# Patient Record
Sex: Female | Born: 1975 | Race: White | Hispanic: No | Smoking: Current every day smoker
Health system: Southern US, Community
[De-identification: ages and names within clinical notes are randomized; demographics above are authoritative.]

## PROBLEM LIST (undated history)

## (undated) DIAGNOSIS — F419 Anxiety disorder, unspecified: Secondary | ICD-10-CM

## (undated) HISTORY — PX: FEMUR SURGERY: SHX943

## (undated) HISTORY — PX: TIBIA FRACTURE SURGERY: SHX806

---

## 2020-03-04 DIAGNOSIS — A6000 Herpesviral infection of urogenital system, unspecified: Secondary | ICD-10-CM

## 2020-03-04 HISTORY — DX: Herpesviral infection of urogenital system, unspecified: A60.00

## 2020-06-22 ENCOUNTER — Ambulatory Visit: Payer: Self-pay | Admitting: Family Medicine

## 2020-10-01 ENCOUNTER — Emergency Department (HOSPITAL_COMMUNITY): Payer: Medicaid Other

## 2020-10-01 ENCOUNTER — Encounter (HOSPITAL_COMMUNITY): Payer: Self-pay | Admitting: Emergency Medicine

## 2020-10-01 ENCOUNTER — Other Ambulatory Visit: Payer: Self-pay

## 2020-10-01 ENCOUNTER — Emergency Department (HOSPITAL_COMMUNITY)
Admission: EM | Admit: 2020-10-01 | Discharge: 2020-10-02 | Disposition: A | Discharge status: Home / Self Care | Payer: Medicaid Other | Attending: Emergency Medicine | Admitting: Emergency Medicine

## 2020-10-01 DIAGNOSIS — F1910 Other psychoactive substance abuse, uncomplicated: Secondary | ICD-10-CM | POA: Diagnosis not present

## 2020-10-01 DIAGNOSIS — Y900 Blood alcohol level of less than 20 mg/100 ml: Secondary | ICD-10-CM | POA: Insufficient documentation

## 2020-10-01 DIAGNOSIS — E876 Hypokalemia: Secondary | ICD-10-CM | POA: Insufficient documentation

## 2020-10-01 DIAGNOSIS — R21 Rash and other nonspecific skin eruption: Secondary | ICD-10-CM | POA: Diagnosis not present

## 2020-10-01 DIAGNOSIS — S0990XA Unspecified injury of head, initial encounter: Secondary | ICD-10-CM | POA: Insufficient documentation

## 2020-10-01 LAB — CBC WITH DIFFERENTIAL/PLATELET
Abs Immature Granulocytes: 0.02 10*3/uL (ref 0.00–0.07)
Basophils Absolute: 0.1 10*3/uL (ref 0.0–0.1)
Basophils Relative: 1 %
Eosinophils Absolute: 0.1 10*3/uL (ref 0.0–0.5)
Eosinophils Relative: 1 %
HCT: 36 % (ref 36.0–46.0)
Hemoglobin: 12.8 g/dL (ref 12.0–15.0)
Immature Granulocytes: 0 %
Lymphocytes Relative: 20 %
Lymphs Abs: 1.7 10*3/uL (ref 0.7–4.0)
MCH: 33.2 pg (ref 26.0–34.0)
MCHC: 35.6 g/dL (ref 30.0–36.0)
MCV: 93.5 fL (ref 80.0–100.0)
Monocytes Absolute: 0.7 10*3/uL (ref 0.1–1.0)
Monocytes Relative: 9 %
Neutro Abs: 5.8 10*3/uL (ref 1.7–7.7)
Neutrophils Relative %: 69 %
Platelets: 322 10*3/uL (ref 150–400)
RBC: 3.85 MIL/uL — ABNORMAL LOW (ref 3.87–5.11)
RDW: 13.2 % (ref 11.5–15.5)
WBC: 8.4 10*3/uL (ref 4.0–10.5)
nRBC: 0 % (ref 0.0–0.2)

## 2020-10-01 LAB — URINALYSIS, ROUTINE W REFLEX MICROSCOPIC
Bilirubin Urine: NEGATIVE
Glucose, UA: NEGATIVE mg/dL
Ketones, ur: 20 mg/dL — AB
Leukocytes,Ua: NEGATIVE
Nitrite: POSITIVE — AB
Protein, ur: 30 mg/dL — AB
Specific Gravity, Urine: 1.029 (ref 1.005–1.030)
pH: 5 (ref 5.0–8.0)

## 2020-10-01 LAB — ETHANOL: Alcohol, Ethyl (B): <10 mg/dL (ref <10)

## 2020-10-01 LAB — COMPREHENSIVE METABOLIC PANEL
ALT: 22 U/L (ref 0–44)
AST: 24 U/L (ref 15–41)
Albumin: 4 g/dL (ref 3.5–5.0)
Alkaline Phosphatase: 42 U/L (ref 38–126)
Anion gap: 10 (ref 5–15)
BUN: 13 mg/dL (ref 6–20)
CO2: 23 mmol/L (ref 22–32)
Calcium: 9 mg/dL (ref 8.9–10.3)
Chloride: 105 mmol/L (ref 98–111)
Creatinine, Ser: 0.97 mg/dL (ref 0.44–1.00)
GFR, Estimated: >60 mL/min (ref >60)
Glucose, Bld: 113 mg/dL — ABNORMAL HIGH (ref 70–99)
Potassium: 3.1 mmol/L — ABNORMAL LOW (ref 3.5–5.1)
Sodium: 138 mmol/L (ref 135–145)
Total Bilirubin: 1.8 mg/dL — ABNORMAL HIGH (ref 0.3–1.2)
Total Protein: 6.7 g/dL (ref 6.5–8.1)

## 2020-10-01 LAB — RAPID URINE DRUG SCREEN, HOSP PERFORMED
Amphetamines: POSITIVE — AB
Barbiturates: NOT DETECTED
Benzodiazepines: POSITIVE — AB
Cocaine: POSITIVE — AB
Opiates: NOT DETECTED
Tetrahydrocannabinol: NOT DETECTED

## 2020-10-01 LAB — ACETAMINOPHEN LEVEL: Acetaminophen (Tylenol), Serum: <10 ug/mL — ABNORMAL LOW (ref 10–30)

## 2020-10-01 LAB — POC URINE PREG, ED: Preg Test, Ur: NEGATIVE

## 2020-10-01 LAB — SALICYLATE LEVEL: Salicylate Lvl: <7 mg/dL — ABNORMAL LOW (ref 7.0–30.0)

## 2020-10-01 MED ORDER — SODIUM CHLORIDE 0.9 % IV BOLUS
1000.0000 mL | Freq: Once | INTRAVENOUS | Status: AC
  Administered 2020-10-01: 1000 mL via INTRAVENOUS

## 2020-10-01 MED ORDER — SODIUM CHLORIDE 0.9 % IV SOLN
1.0000 g | Freq: Once | INTRAVENOUS | Status: AC
  Administered 2020-10-01: 1 g via INTRAVENOUS
  Filled 2020-10-01: qty 10

## 2020-10-01 MED ORDER — FOSFOMYCIN TROMETHAMINE 3 G PO PACK
3.0000 g | PACK | Freq: Once | ORAL | Status: AC
  Administered 2020-10-01: 3 g via ORAL
  Filled 2020-10-01 (×2): qty 3
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
## 2020-10-01 NOTE — ED Notes (Signed)
PT in bed resting. Medicated as ordered

## 2020-10-01 NOTE — ED Notes (Signed)
Called pharmacy to have them send meds
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
## 2020-10-01 NOTE — ED Notes (Signed)
Pt BP 80/67, Dr Adela Lank notified, 1L NS bolus started

## 2020-10-01 NOTE — ED Notes (Signed)
PT awake ambulated to the bathroom without assistance. Food and drink provided as requested

## 2020-10-02 NOTE — ED Notes (Signed)
Pt ready to go d/c papers given and IV out. PT states "she has no where to go and is looking for a number."

## 2020-10-11 ENCOUNTER — Encounter (HOSPITAL_COMMUNITY): Payer: Self-pay | Admitting: *Deleted

## 2020-10-11 ENCOUNTER — Other Ambulatory Visit: Payer: Self-pay

## 2020-10-11 ENCOUNTER — Emergency Department (HOSPITAL_COMMUNITY)
Admission: EM | Admit: 2020-10-11 | Discharge: 2020-10-11 | Disposition: A | Discharge status: Home / Self Care | Payer: Medicaid Other | Attending: Emergency Medicine | Admitting: Emergency Medicine

## 2020-10-11 DIAGNOSIS — Z5321 Procedure and treatment not carried out due to patient leaving prior to being seen by health care provider: Secondary | ICD-10-CM | POA: Insufficient documentation

## 2020-10-11 DIAGNOSIS — R21 Rash and other nonspecific skin eruption: Secondary | ICD-10-CM | POA: Insufficient documentation

## 2020-10-11 HISTORY — DX: Anxiety disorder, unspecified: F41.9
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
## 2020-10-11 NOTE — ED Notes (Signed)
The patient LWBS. The patient informed a Barrister's clerk

## 2020-10-13 ENCOUNTER — Encounter: Payer: Self-pay | Admitting: *Deleted

## 2020-10-13 NOTE — Congregational Nurse Program (Signed)
  Dept: [phone]   Congregational Nurse Program Note  Date of Encounter: 10/13/2020  Past Medical History: Past Medical History:  Diagnosis Date   Anxiety     Encounter Details:  CNP Questionnaire - 10/13/20 1108       Questionnaire   Do you give verbal consent to treat you today? Yes    Visit Setting Church or Organization    Location Patient Served At [facility]    Patient Status Homeless    Medical Provider No    Insurance Medicaid    Intervention Support;Refer    Housing/Utilities No permanent housing    Referrals Mobile Bus/Van - [place]             Client seen at [facility] with rash on multiple parts of her body including arms, legs and buttocks. She reports she has taken benedryl with no relief. She also has areas that appear to be insect bites on her legs and says she has been sleeping outside. Referred to [facility] clinic 10/14/20 at [facility]. Colvin Blatt W RN CN [phone]

## 2020-11-16 ENCOUNTER — Emergency Department (HOSPITAL_COMMUNITY): Payer: Medicaid Other

## 2020-11-16 ENCOUNTER — Emergency Department (HOSPITAL_COMMUNITY)
Admission: EM | Admit: 2020-11-16 | Discharge: 2020-11-16 | Disposition: A | Discharge status: Home / Self Care | Payer: Medicaid Other | Attending: Emergency Medicine | Admitting: Emergency Medicine

## 2020-11-16 DIAGNOSIS — M545 Low back pain, unspecified: Secondary | ICD-10-CM | POA: Insufficient documentation

## 2020-11-16 DIAGNOSIS — H1131 Conjunctival hemorrhage, right eye: Secondary | ICD-10-CM | POA: Diagnosis not present

## 2020-11-16 DIAGNOSIS — F419 Anxiety disorder, unspecified: Secondary | ICD-10-CM | POA: Diagnosis not present

## 2020-11-16 DIAGNOSIS — R519 Headache, unspecified: Secondary | ICD-10-CM | POA: Diagnosis not present

## 2020-11-16 DIAGNOSIS — F17210 Nicotine dependence, cigarettes, uncomplicated: Secondary | ICD-10-CM | POA: Diagnosis not present

## 2020-11-16 DIAGNOSIS — Y9301 Activity, walking, marching and hiking: Secondary | ICD-10-CM | POA: Diagnosis not present

## 2020-11-16 DIAGNOSIS — Y92410 Unspecified street and highway as the place of occurrence of the external cause: Secondary | ICD-10-CM | POA: Insufficient documentation

## 2020-11-16 DIAGNOSIS — J3489 Other specified disorders of nose and nasal sinuses: Secondary | ICD-10-CM | POA: Diagnosis not present

## 2020-11-16 DIAGNOSIS — N9489 Other specified conditions associated with female genital organs and menstrual cycle: Secondary | ICD-10-CM | POA: Insufficient documentation

## 2020-11-16 DIAGNOSIS — S022XXA Fracture of nasal bones, initial encounter for closed fracture: Secondary | ICD-10-CM

## 2020-11-16 LAB — I-STAT BETA HCG BLOOD, ED (MC, WL, AP ONLY): I-stat hCG, quantitative: <5 m[IU]/mL (ref <5)

## 2020-11-16 MED ORDER — IBUPROFEN 800 MG PO TABS
800.0000 mg | ORAL_TABLET | Freq: Once | ORAL | Status: AC
  Administered 2020-11-16: 800 mg via ORAL
  Filled 2020-11-16: qty 1

## 2020-11-16 MED ORDER — OXYCODONE-ACETAMINOPHEN 5-325 MG PO TABS
1.0000 | ORAL_TABLET | Freq: Once | ORAL | Status: AC
  Administered 2020-11-16: 1 via ORAL
  Filled 2020-11-16: qty 1

## 2020-11-16 MED ORDER — LORAZEPAM 1 MG PO TABS
1.0000 mg | ORAL_TABLET | Freq: Once | ORAL | Status: AC
  Administered 2020-11-16: 1 mg via ORAL
  Filled 2020-11-16: qty 1

## 2020-11-16 MED ORDER — FLUORESCEIN SODIUM 1 MG OP STRP
1.0000 | ORAL_STRIP | Freq: Once | OPHTHALMIC | Status: AC
  Administered 2020-11-16: 1 via OPHTHALMIC
  Filled 2020-11-16: qty 1

## 2020-11-16 MED ORDER — TETRACAINE HCL 0.5 % OP SOLN
2.0000 [drp] | Freq: Once | OPHTHALMIC | Status: AC
  Administered 2020-11-16: 2 [drp] via OPHTHALMIC
  Filled 2020-11-16: qty 4
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
## 2020-11-16 NOTE — ED Notes (Signed)
Prpl and Lt Green tubes sent to lab as saves.
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
## 2020-11-16 NOTE — ED Notes (Signed)
Pt ambulatory without assistance.  
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
## 2021-06-06 ENCOUNTER — Other Ambulatory Visit: Payer: Self-pay | Admitting: *Deleted

## 2021-06-06 ENCOUNTER — Encounter: Payer: Self-pay | Admitting: Physician Assistant

## 2021-06-06 DIAGNOSIS — F411 Generalized anxiety disorder: Secondary | ICD-10-CM

## 2021-06-06 MED ORDER — HYDROXYZINE HCL 10 MG PO TABS: 25.0000 mg | ORAL_TABLET | Freq: Four times a day (QID) | ORAL | Status: DC | PRN

## 2021-06-06 NOTE — Progress Notes (Addendum)
Pt has been at the shelter for about a week.  ? ?She has been in [place] for about a year. ? ?She has a history of anxiety, says she has been on on clonazepam 2 mg. ? ?Clonidine 2 mg Rx confirmed, take 1/2-1 tab tid prn, 90. Dr Zenon Mayo 01/2021 ? ?Hydroxyzine 25 mg tid, #120  Dr Ollen Bowl (pt says is her father, has retired and not local) ? ?05/2020 Acyclovir 800 mg # 14 was for genital herpes, does not need it now.  ? ?Panic attacks, she gets them multiple times every day. They incapacitate her in many ways. She has been having them for the last 25 years.  ? ?Her daughter was 58 months old when she was diagnosed. She only has one child. Her daughter is now 70, lives in [place] with  her boyfriend. ? ?[facility] was [facility] for [facility], transportation to be provided.  ? ?We will give rx for hydroxyzine. She was given Tylenol pm.  ? ?Theodore Demark, PA-C ?06/06/2021 ?2:37 PM ? ? ? ? ? ? ? ? ? ?

## 2021-06-07 ENCOUNTER — Other Ambulatory Visit (HOSPITAL_COMMUNITY): Payer: Self-pay

## 2021-06-07 MED ORDER — HYDROXYZINE PAMOATE 25 MG PO CAPS
25.0000 mg | ORAL_CAPSULE | Freq: Four times a day (QID) | ORAL | 0 refills | Status: DC | PRN
  Filled 2021-06-07: qty 120, 30d supply, fill #0

## 2021-06-13 ENCOUNTER — Encounter: Payer: Self-pay | Admitting: Physician Assistant

## 2021-06-13 NOTE — Progress Notes (Signed)
Pt has been taking someone else's gabapentin. She has stopped that because it made her feel bad. She is concerned that she will have withdrawal from this. ? ?She took the hydrozyzine once, but said it made her feel bad.  ? ?It was suggested that she take it before bedtime, timing tbd by her. ? ?She has seen Dr Zenon Mayo in [place]. Previously in [place], but has been here about 12 years.  ?[address] ?[place] [address] ?[phone] ? ?Theodore Demark, PA-C ?06/13/2021 ?3:48 PM ? ? ? ? ? ?

## 2021-06-18 ENCOUNTER — Encounter (HOSPITAL_COMMUNITY): Payer: Self-pay | Admitting: Licensed Clinical Social Worker

## 2021-06-18 ENCOUNTER — Ambulatory Visit (INDEPENDENT_AMBULATORY_CARE_PROVIDER_SITE_OTHER): Payer: Medicaid Other | Admitting: Licensed Clinical Social Worker

## 2021-06-18 DIAGNOSIS — F411 Generalized anxiety disorder: Secondary | ICD-10-CM | POA: Diagnosis not present

## 2021-06-18 DIAGNOSIS — F331 Major depressive disorder, recurrent, moderate: Secondary | ICD-10-CM | POA: Insufficient documentation

## 2021-06-18 NOTE — Progress Notes (Signed)
Comprehensive Clinical Assessment (CCA) Screening, Triage and Referral Note ? ?06/18/2021 ?Lile Mccurley ?[id number] ? ?Chief Complaint:  ?Chief Complaint  ?Patient presents with  ? Panic Attack  ? Anxiety  ? Depression  ? ?Visit Diagnosis: MDD and GAD  ? ?Client is a 46 year old female. Client is referred by [facility] fpr depression and anxiety   ?Client states mental health symptoms as evidenced by:  ? ?Depression Difficulty Concentrating; Fatigue; Hopelessness; Worthlessness; Increase/decrease in appetite; Irritability; Sleep (too much or little) Difficulty Concentrating; Fatigue; Hopelessness; Worthlessness; Increase/decrease in appetite; Irritability; Sleep (too much or little)  ?Duration of Depressive Symptoms Greater than two weeks Greater than two weeks  ?Mania Irritability Irritability  ?Anxiety Irritability; Worrying; Tension; Sleep; Restlessness; Fatigue; Difficulty concentrating Irritability; Worrying; Tension; Sleep; Restlessness; Fatigue; Difficulty concentrating  ?Psychosis None None  ?Trauma None None  ?Obsessions None None  ?Compulsions None None  ?Inattention None None  ?Hyperactivity/Impulsivity None None  ?Oppositional/Defiant Behaviors None None  ?Emotional Irregularity None None  ? ?Client denies suicidal and homicidal ideations at this time  ?Client denies hallucinations and delusions at this time . ? ?Client was screened for the following SDOH: Smoking, financials, food, transportation, exercise, stress\tension, social interaction, domestic violence, depression, and housing ? ?Assessment Information that integrates subjective and objective details with a therapist's professional interpretation:  ? ? Patient was alert and oriented x5.  Patient was pleasant, cooperative, maintained good eye contact.  She engaged well in therapy session and was dressed casually. she presented today with depressed and anxious mood affect ? ? Patient comes in today for as a referral from the [facility].  Patient  reports that she has been living there for about 3 weeks and it is classified as a shelter.  Patient reports history of anxiety and depression.  This has been managed in the past by Lexapro last taken 2 years ago.  And also Klonopin last dose taken 3 months ago.  Patient reports primary goal is to get established with [facility] for medication management.  Patient reports that her primary support system is the [facility].  Patient reports average relationship with her family as she navigates through her mental health and housing situation.  LCSW did recommend therapy to patient and patient declined it at this time opting for medication management only. ? ?Client meets criteria for: MDD and GAD  ?Client states use of the following substances: None reported ? ? ? ? ? ?Client was in agreement with treatment recommendations. ? ? ?Patient Reported Information ? ?What Do You Feel Would Help You the Most Today? Treatment for Depression or other mood problem; Stress Management ? ? ?Have You Recently Had Any Thoughts About Hurting Yourself? No ? ?Are You Planning to Commit Suicide/Harm Yourself At This time? No ? ? ?Have you Recently Had Thoughts About Hurting Someone Karolee Ohs? No ? ?Are You Planning to Harm Someone at This Time? No ? ? ? ?Have You Used Any Alcohol or Drugs in the Past 24 Hours? No ? ?Do You Currently Have a Therapist/Psychiatrist? No ? ? ?Have You Been Recently Discharged From Any Office Practice or Programs? No ? ?CCA Screening Triage Referral Assessment ?Type of Contact: Face-to-Face ? ?Location of Assessment: [facility] Assessment Services ? ?Provider Location: [facility] Assessment Services ? ? ?Is CPS involved or ever been involved? Never ? ?Is APS involved or ever been involved? Never ? ? ?Patient Determined To Be At Risk for Harm To Self or Others Based on Review of  Patient Reported Information or Presenting Complaint? No ? ? ? ?[place] of Residence: [place] ? ? ?  ? ?Weber Cooks, LCSW ? ? ?  ?  ?  ? ? ?

## 2021-06-20 ENCOUNTER — Ambulatory Visit (INDEPENDENT_AMBULATORY_CARE_PROVIDER_SITE_OTHER): Payer: Medicaid Other | Admitting: Physician Assistant

## 2021-06-20 ENCOUNTER — Encounter (HOSPITAL_COMMUNITY): Payer: Self-pay | Admitting: Physician Assistant

## 2021-06-20 VITALS — BP 133/69 | HR 89 | Ht 63.0 in | Wt 166.0 lb

## 2021-06-20 DIAGNOSIS — F411 Generalized anxiety disorder: Secondary | ICD-10-CM | POA: Diagnosis not present

## 2021-06-20 DIAGNOSIS — F331 Major depressive disorder, recurrent, moderate: Secondary | ICD-10-CM

## 2021-06-20 MED ORDER — HYDROXYZINE HCL 25 MG PO TABS: 25.0000 mg | ORAL_TABLET | Freq: Three times a day (TID) | ORAL | 1 refills | Status: DC | PRN

## 2021-06-20 MED ORDER — BUSPIRONE HCL 7.5 MG PO TABS: 7.5000 mg | ORAL_TABLET | Freq: Two times a day (BID) | ORAL | 1 refills | Status: DC

## 2021-06-20 MED ORDER — ESCITALOPRAM OXALATE 10 MG PO TABS: 10.0000 mg | ORAL_TABLET | Freq: Every day | ORAL | 1 refills | Status: DC

## 2021-06-20 MED ORDER — CLONAZEPAM 0.5 MG PO TABS
0.5000 mg | ORAL_TABLET | Freq: Three times a day (TID) | ORAL | 0 refills | Status: DC | PRN
Start: 2021-06-20 — End: 2021-07-04
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
## 2021-07-04 ENCOUNTER — Other Ambulatory Visit (HOSPITAL_COMMUNITY): Payer: Self-pay | Admitting: Physician Assistant

## 2021-07-04 ENCOUNTER — Telehealth (HOSPITAL_COMMUNITY): Payer: Self-pay | Admitting: Physician Assistant

## 2021-07-04 DIAGNOSIS — F411 Generalized anxiety disorder: Secondary | ICD-10-CM

## 2021-07-04 MED ORDER — CLONAZEPAM 0.5 MG PO TABS: 0.5000 mg | ORAL_TABLET | Freq: Three times a day (TID) | ORAL | 0 refills | Status: DC | PRN

## 2021-07-04 NOTE — Telephone Encounter (Signed)
Provider was contacted by Earnestine Mealing. Patient's medication to be e-prescribed to pharmacy of choice.

## 2021-07-04 NOTE — Telephone Encounter (Signed)
Patient called, requesting refill on Klonopin. ?

## 2021-07-04 NOTE — Progress Notes (Signed)
Provider was contacted by Lyric Dew. Patient's medication to be e-prescribed to pharmacy of choice.

## 2021-07-18 ENCOUNTER — Telehealth (HOSPITAL_COMMUNITY): Payer: Self-pay | Admitting: Physician Assistant

## 2021-07-18 ENCOUNTER — Other Ambulatory Visit (HOSPITAL_COMMUNITY): Payer: Self-pay | Admitting: Physician Assistant

## 2021-07-18 DIAGNOSIS — F411 Generalized anxiety disorder: Secondary | ICD-10-CM

## 2021-07-18 NOTE — Telephone Encounter (Signed)
Patient contacted office to request refill on Klonopin.  ?

## 2021-07-26 NOTE — Telephone Encounter (Signed)
Provider was contacted by Earnestine Mealing regarding refill request for patient's medication.  Patient's medication was e-prescribed to pharmacy of choice.

## 2021-08-01 ENCOUNTER — Telehealth (HOSPITAL_COMMUNITY): Payer: Self-pay | Admitting: *Deleted

## 2021-08-01 ENCOUNTER — Other Ambulatory Visit (HOSPITAL_COMMUNITY): Payer: Self-pay | Admitting: Physician Assistant

## 2021-08-01 DIAGNOSIS — F411 Generalized anxiety disorder: Secondary | ICD-10-CM

## 2021-08-01 NOTE — Telephone Encounter (Signed)
Called her back and had a poor connection. Tried to tell her she had available refills not sure what she was able to understand.

## 2021-08-02 NOTE — Telephone Encounter (Signed)
Opened a second time in error. 

## 2021-08-02 NOTE — Telephone Encounter (Signed)
Opened in error a second time. 

## 2021-08-07 ENCOUNTER — Ambulatory Visit (INDEPENDENT_AMBULATORY_CARE_PROVIDER_SITE_OTHER): Payer: Medicaid Other | Admitting: Physician Assistant

## 2021-08-07 ENCOUNTER — Encounter (HOSPITAL_COMMUNITY): Payer: Self-pay | Admitting: Physician Assistant

## 2021-08-07 DIAGNOSIS — F411 Generalized anxiety disorder: Secondary | ICD-10-CM

## 2021-08-07 DIAGNOSIS — F331 Major depressive disorder, recurrent, moderate: Secondary | ICD-10-CM | POA: Diagnosis not present

## 2021-08-07 MED ORDER — BUSPIRONE HCL 7.5 MG PO TABS: 7.5000 mg | ORAL_TABLET | Freq: Two times a day (BID) | ORAL | 2 refills | Status: DC

## 2021-08-07 MED ORDER — HYDROXYZINE HCL 25 MG PO TABS: 25.0000 mg | ORAL_TABLET | Freq: Three times a day (TID) | ORAL | 2 refills | Status: DC | PRN

## 2021-08-07 MED ORDER — ESCITALOPRAM OXALATE 10 MG PO TABS: 10.0000 mg | ORAL_TABLET | Freq: Every day | ORAL | 2 refills | Status: DC

## 2021-08-10 ENCOUNTER — Encounter (HOSPITAL_COMMUNITY): Payer: Self-pay | Admitting: Physician Assistant

## 2021-08-10 NOTE — Progress Notes (Addendum)
BH MD/PA/NP OP Progress Note  08/10/2021 9:03 PM Jill Pena  MRN:  [id number]  Chief Complaint:  Chief Complaint  Patient presents with   Medication Management    F/U MM   HPI:   Jill Pena is a 46 year old female with a past psychiatric history significant for generalized anxiety disorder and major depressive disorder who presents to [facility] for follow-up and medication management.  Patient is currently being managed on the following medications:  Clonazepam 0.5 mg 3 times daily as needed Hydroxyzine 25 mg 3 times daily as needed Escitalopram 10 mg daily Buspirone 7.5 mg 2 times daily  Patient reports that she feels so much better since being placed back on her current medications.  Patient reports no issues or concerns regarding her current medication regimen.  Patient denies the need for dosage adjustments at this time and is requesting refills on her medications following the conclusion of the encounter.  Since being placed back on her medications, patient denies experiencing depressive symptoms and states that her anxiety has been virtually nonexistent.  Patient denies any new stressors at this time.  Patient is alert and oriented x4, pleasant, calm, cooperative, and fully engaged in conversation during the encounter.  Patient reports being happy and relieved.  Patient denies suicidal or homicidal ideations.  She further denies auditory or visual hallucinations and does not appear to be responding to internal/external stimuli.  Patient endorses good sleep and receives on average 8 to 12 hours of sleep each night.  Patient endorses good appetite and eats on average 2 to 3 meals per day.  Patient denies alcohol consumption and illicit drug use.  Patient endorses tobacco use of "on average 1/2 pack/day.  Visit Diagnosis:    ICD-10-CM   1. GAD (generalized anxiety disorder)  F41.1 escitalopram (LEXAPRO) 10 MG tablet    hydrOXYzine (ATARAX)  25 MG tablet    busPIRone (BUSPAR) 7.5 MG tablet    2. Major depressive disorder, recurrent episode, moderate (HCC)  F33.1 escitalopram (LEXAPRO) 10 MG tablet      Past Psychiatric History:  Generalized anxiety disorder Major depressive disorder  Past Medical History:  Past Medical History:  Diagnosis Date   Anxiety    Genital herpes 2022    Past Surgical History:  Procedure Laterality Date   FEMUR SURGERY     TIBIA FRACTURE SURGERY      Family Psychiatric History:  Mother - anxiety/panic attacks Grandmother (maternal) - OCD  Family History:  Family History  Problem Relation Age of Onset   Anxiety disorder Mother     Social History:  Social History   Socioeconomic History   Marital status: Single    Spouse name: Not on file   Number of children: Not on file   Years of education: Not on file   Highest education level: Not on file  Occupational History   Not on file  Tobacco Use   Smoking status: Every Day    Packs/day: 1.00    Types: Cigarettes   Smokeless tobacco: Never  Substance and Sexual Activity   Alcohol use: Not Currently   Drug use: Not Currently   Sexual activity: Yes    Partners: Male    Birth control/protection: None  Other Topics Concern   Not on file  Social History Narrative   Not on file   Social Determinants of Health   Financial Resource Strain: High Risk (06/18/2021)   Overall Financial Resource Strain (CARDIA)    Difficulty of  Paying Living Expenses: Very hard  Food Insecurity: Food Insecurity Present (06/18/2021)   Hunger Vital Sign    Worried About Running Out of Food in the Last Year: Often true    Ran Out of Food in the Last Year: Often true  Transportation Needs: Unmet Transportation Needs (06/18/2021)   PRAPARE - Administrator, Civil ServiceTransportation    Lack of Transportation (Medical): Yes    Lack of Transportation (Non-Medical): Yes  Physical Activity: Inactive (06/18/2021)   Exercise Vital Sign    Days of Exercise per Week: 0 days     Minutes of Exercise per Session: 0 min  Stress: Stress Concern Present (06/18/2021)   [employer] - Occupational Stress Questionnaire    Feeling of Stress : Very much  Social Connections: Socially Isolated (06/18/2021)   Social Connection and Isolation Panel [NHANES]    Frequency of Communication with Friends and Family: Once a week    Frequency of Social Gatherings with Friends and Family: Once a week    Attends Religious Services: Never    Database administratorActive Member of Clubs or Organizations: No    Attends Engineer, structuralClub or Organization Meetings: Never    Marital Status: Divorced    Allergies: No Known Allergies  Metabolic Disorder Labs: No results found for: "HGBA1C", "MPG" No results found for: "PROLACTIN" No results found for: "CHOL", "TRIG", "HDL", "CHOLHDL", "VLDL", "LDLCALC" No results found for: "TSH"  Therapeutic Level Labs: No results found for: "LITHIUM" No results found for: "VALPROATE" No results found for: "CBMZ"  Current Medications: Current Outpatient Medications  Medication Sig Dispense Refill   clonazePAM (KLONOPIN) 0.5 MG tablet TAKE 1 TABLET(0.5 MG) BY MOUTH THREE TIMES DAILY AS NEEDED FOR ANXIETY 42 tablet 0   busPIRone (BUSPAR) 7.5 MG tablet Take 1 tablet (7.5 mg total) by mouth 2 (two) times daily. 60 tablet 2   escitalopram (LEXAPRO) 10 MG tablet Take 1 tablet (10 mg total) by mouth daily. 30 tablet 2   hydrOXYzine (ATARAX) 25 MG tablet Take 1 tablet (25 mg total) by mouth 3 (three) times daily as needed for anxiety. 75 tablet 2   No current facility-administered medications for this visit.     Musculoskeletal: Strength & Muscle Tone: within normal limits Gait & Station: normal Patient leans: N/A  Psychiatric Specialty Exam: Review of Systems  Blood pressure (!) 118/58, pulse 88, height 5\' 3"  (1.6 m), weight 158 lb (71.7 kg), unknown if currently breastfeeding.Body mass index is 27.99 kg/m.  General Appearance: Casual  Eye Contact:  Good   Speech:  Clear and Coherent and Normal Rate  Volume:  Normal  Mood:  Euthymic  Affect:  Appropriate  Thought Process:  Coherent and Descriptions of Associations: Intact  Orientation:  Full (Time, Place, and Person)  Thought Content: WDL   Suicidal Thoughts:  No  Homicidal Thoughts:  No  Memory:  Immediate;   Good Recent;   Good Remote;   Fair  Judgement:  Fair  Insight:  Fair  Psychomotor Activity:  Normal  Concentration:  Concentration: Good and Attention Span: Good  Recall:  Fair  Fund of Knowledge: Good  Language: Good  Akathisia:  No  Handed:  Left  AIMS (if indicated): not done  Assets:  Communication Skills Desire for Improvement Housing Social Support  ADL's:  Intact  Cognition: WNL  Sleep:  Good   Screenings: GAD-7    Flowsheet Row [facility]  from 06/18/2021 in [facility]  Total GAD-7 Score 0 17 19      PHQ2-9    Flowsheet Row Office Visit from 08/07/2021 in [facility] Office Visit from 06/20/2021 in [facility] Counselor from 06/18/2021 in [facility]  PHQ-2 Total Score 0 5 5  PHQ-9 Total Score -- 20 23      Flowsheet Row Office Visit from 08/07/2021 in [facility] Office Visit from 06/20/2021 in [facility] Counselor from 06/18/2021 in [facility]  C-SSRS RISK CATEGORY No Risk No Risk No Risk        Assessment and Plan:   Evangela Heffler is a 46 year old female with a past psychiatric history significant for generalized anxiety disorder and major depressive disorder who presents to [facility] for follow-up and medication management.  Patient reports no issues or concerns  regarding her current medication regimen.  Patient denies experiencing depressive symptoms or anxiety since being placed on her current medication regimen.  Patient appears to be stable and will continue taking her medications as prescribed.  Patient's medications to be e-prescribed to pharmacy of choice.  Collaboration of Care: Collaboration of Care: Medication Management AEB provider managing patient's psychiatric medications and Psychiatrist AEB patient being followed by mental health provider  Patient/Guardian was advised Release of Information must be obtained prior to any record release in order to collaborate their care with an outside provider. Patient/Guardian was advised if they have not already done so to contact the registration department to sign all necessary forms in order for [place] to release information regarding their care.   Consent: Patient/Guardian gives verbal consent for treatment and assignment of benefits for services provided during this visit. Patient/Guardian expressed understanding and agreed to proceed.   1. GAD (generalized anxiety disorder) Patient to continue taking clonazepam 0.5 mg 3 times daily as needed for the management of her generalized anxiety disorder  - escitalopram (LEXAPRO) 10 MG tablet; Take 1 tablet (10 mg total) by mouth daily.  Dispense: 30 tablet; Refill: 2 - hydrOXYzine (ATARAX) 25 MG tablet; Take 1 tablet (25 mg total) by mouth 3 (three) times daily as needed for anxiety.  Dispense: 75 tablet; Refill: 2 - busPIRone (BUSPAR) 7.5 MG tablet; Take 1 tablet (7.5 mg total) by mouth 2 (two) times daily.  Dispense: 60 tablet; Refill: 2  2. Major depressive disorder, recurrent episode, moderate (HCC)  - escitalopram (LEXAPRO) 10 MG tablet; Take 1 tablet (10 mg total) by mouth daily.  Dispense: 30 tablet; Refill: 2  Patient to follow up in 2 months Provider spent a total of 12 minutes with the patient/reviewing patient's chart  Meta Hatchet,  PA 08/10/2021, 9:03 PM

## 2021-08-11 ENCOUNTER — Emergency Department (HOSPITAL_COMMUNITY): Payer: Medicaid Other

## 2021-08-11 ENCOUNTER — Emergency Department (HOSPITAL_COMMUNITY)
Admission: EM | Admit: 2021-08-11 | Discharge: 2021-08-13 | Disposition: A | Discharge status: Home / Self Care | Payer: Medicaid Other | Attending: Emergency Medicine | Admitting: Emergency Medicine

## 2021-08-11 ENCOUNTER — Other Ambulatory Visit: Payer: Self-pay

## 2021-08-11 ENCOUNTER — Encounter (HOSPITAL_COMMUNITY): Payer: Self-pay

## 2021-08-11 DIAGNOSIS — Y900 Blood alcohol level of less than 20 mg/100 ml: Secondary | ICD-10-CM | POA: Diagnosis not present

## 2021-08-11 DIAGNOSIS — R519 Headache, unspecified: Secondary | ICD-10-CM | POA: Insufficient documentation

## 2021-08-11 DIAGNOSIS — M79631 Pain in right forearm: Secondary | ICD-10-CM | POA: Diagnosis not present

## 2021-08-11 DIAGNOSIS — R0789 Other chest pain: Secondary | ICD-10-CM | POA: Diagnosis not present

## 2021-08-11 DIAGNOSIS — F1510 Other stimulant abuse, uncomplicated: Secondary | ICD-10-CM | POA: Insufficient documentation

## 2021-08-11 DIAGNOSIS — M25521 Pain in right elbow: Secondary | ICD-10-CM | POA: Insufficient documentation

## 2021-08-11 DIAGNOSIS — F1410 Cocaine abuse, uncomplicated: Secondary | ICD-10-CM | POA: Insufficient documentation

## 2021-08-11 DIAGNOSIS — F1310 Sedative, hypnotic or anxiolytic abuse, uncomplicated: Secondary | ICD-10-CM | POA: Insufficient documentation

## 2021-08-11 LAB — COMPREHENSIVE METABOLIC PANEL
ALT: 17 U/L (ref 0–44)
AST: 18 U/L (ref 15–41)
Albumin: 3.4 g/dL — ABNORMAL LOW (ref 3.5–5.0)
Alkaline Phosphatase: 52 U/L (ref 38–126)
Anion gap: 12 (ref 5–15)
BUN: 5 mg/dL — ABNORMAL LOW (ref 6–20)
CO2: 25 mmol/L (ref 22–32)
Calcium: 9.1 mg/dL (ref 8.9–10.3)
Chloride: 104 mmol/L (ref 98–111)
Creatinine, Ser: 0.78 mg/dL (ref 0.44–1.00)
GFR, Estimated: >60 mL/min (ref >60)
Glucose, Bld: 94 mg/dL (ref 70–99)
Potassium: 3.6 mmol/L (ref 3.5–5.1)
Sodium: 141 mmol/L (ref 135–145)
Total Bilirubin: 0.6 mg/dL (ref 0.3–1.2)
Total Protein: 6.7 g/dL (ref 6.5–8.1)

## 2021-08-11 LAB — RAPID URINE DRUG SCREEN, HOSP PERFORMED
Amphetamines: POSITIVE — AB
Barbiturates: NOT DETECTED
Benzodiazepines: POSITIVE — AB
Cocaine: POSITIVE — AB
Opiates: NOT DETECTED
Tetrahydrocannabinol: NOT DETECTED

## 2021-08-11 LAB — URINALYSIS, ROUTINE W REFLEX MICROSCOPIC
Bilirubin Urine: NEGATIVE
Glucose, UA: NEGATIVE mg/dL
Ketones, ur: 5 mg/dL — AB
Leukocytes,Ua: NEGATIVE
Nitrite: NEGATIVE
Protein, ur: NEGATIVE mg/dL
Specific Gravity, Urine: >1.046 — ABNORMAL HIGH (ref 1.005–1.030)
pH: 5 (ref 5.0–8.0)

## 2021-08-11 LAB — CBC
HCT: 34.5 % — ABNORMAL LOW (ref 36.0–46.0)
Hemoglobin: 12.2 g/dL (ref 12.0–15.0)
MCH: 33.3 pg (ref 26.0–34.0)
MCHC: 35.4 g/dL (ref 30.0–36.0)
MCV: 94.3 fL (ref 80.0–100.0)
Platelets: 350 10*3/uL (ref 150–400)
RBC: 3.66 MIL/uL — ABNORMAL LOW (ref 3.87–5.11)
RDW: 12.5 % (ref 11.5–15.5)
WBC: 9 10*3/uL (ref 4.0–10.5)
nRBC: 0 % (ref 0.0–0.2)

## 2021-08-11 LAB — MAGNESIUM: Magnesium: 2 mg/dL (ref 1.7–2.4)

## 2021-08-11 LAB — I-STAT BETA HCG BLOOD, ED (MC, WL, AP ONLY): I-stat hCG, quantitative: <5 m[IU]/mL (ref <5)

## 2021-08-11 LAB — ETHANOL: Alcohol, Ethyl (B): <10 mg/dL (ref <10)

## 2021-08-11 MED ORDER — THIAMINE HCL 100 MG/ML IJ SOLN
100.0000 mg | Freq: Once | INTRAMUSCULAR | Status: AC
  Administered 2021-08-11: 100 mg via INTRAVENOUS
  Filled 2021-08-11: qty 2

## 2021-08-11 MED ORDER — KETOROLAC TROMETHAMINE 15 MG/ML IJ SOLN
15.0000 mg | Freq: Once | INTRAMUSCULAR | Status: AC
  Administered 2021-08-11: 15 mg via INTRAVENOUS
  Filled 2021-08-11: qty 1

## 2021-08-11 MED ORDER — LACTATED RINGERS IV BOLUS
1000.0000 mL | Freq: Once | INTRAVENOUS | Status: AC
  Administered 2021-08-11: 1000 mL via INTRAVENOUS

## 2021-08-11 MED ORDER — OXYCODONE-ACETAMINOPHEN 5-325 MG PO TABS
1.0000 | ORAL_TABLET | Freq: Once | ORAL | Status: AC
  Administered 2021-08-11: 1 via ORAL
  Filled 2021-08-11: qty 1

## 2021-08-11 MED ORDER — IOHEXOL 300 MG/ML  SOLN
100.0000 mL | Freq: Once | INTRAMUSCULAR | Status: AC | PRN
  Administered 2021-08-11: 100 mL via INTRAVENOUS

## 2021-08-11 MED ORDER — METHOCARBAMOL 500 MG PO TABS
750.0000 mg | ORAL_TABLET | Freq: Once | ORAL | Status: AC
  Administered 2021-08-11: 750 mg via ORAL
  Filled 2021-08-11: qty 2
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
## 2021-08-11 NOTE — Discharge Instructions (Addendum)
Shelters  [facility] Crisis Line: [phone] (24 hours a day)    [facility] Crisis Line [phone]  [facility] [place] [phone]  [facility] [place] [phone]  [facility]  [phone]  
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
## 2021-08-15 ENCOUNTER — Telehealth (HOSPITAL_COMMUNITY): Payer: Self-pay | Admitting: *Deleted

## 2021-08-15 ENCOUNTER — Other Ambulatory Visit (HOSPITAL_COMMUNITY): Payer: Self-pay | Admitting: Physician Assistant

## 2021-08-15 DIAGNOSIS — F411 Generalized anxiety disorder: Secondary | ICD-10-CM

## 2021-08-15 NOTE — Telephone Encounter (Signed)
Patient Called Requested Refill clonazePAM (KLONOPIN) 0.5 MG tablet TAKE 1 TABLET(0.5 MG) BY MOUTH THREE TIMES DAILY AS NEEDED FOR ANXIETY

## 2021-08-17 NOTE — Telephone Encounter (Signed)
Patient's medication was filled and sent to pharmacy of choice.

## 2021-08-18 ENCOUNTER — Emergency Department (HOSPITAL_COMMUNITY): Payer: Medicaid Other

## 2021-08-18 ENCOUNTER — Emergency Department (HOSPITAL_COMMUNITY)
Admission: EM | Admit: 2021-08-18 | Discharge: 2021-08-18 | Disposition: A | Discharge status: Home / Self Care | Payer: Medicaid Other | Attending: Emergency Medicine | Admitting: Emergency Medicine

## 2021-08-18 ENCOUNTER — Encounter (HOSPITAL_COMMUNITY): Payer: Self-pay | Admitting: Radiology

## 2021-08-18 DIAGNOSIS — R0789 Other chest pain: Secondary | ICD-10-CM | POA: Diagnosis present

## 2021-08-18 DIAGNOSIS — R1011 Right upper quadrant pain: Secondary | ICD-10-CM | POA: Diagnosis not present

## 2021-08-18 LAB — URINALYSIS, ROUTINE W REFLEX MICROSCOPIC
Bacteria, UA: NONE SEEN
Bilirubin Urine: NEGATIVE
Glucose, UA: NEGATIVE mg/dL
Ketones, ur: NEGATIVE mg/dL
Leukocytes,Ua: NEGATIVE
Nitrite: NEGATIVE
Protein, ur: NEGATIVE mg/dL
Specific Gravity, Urine: 1.025 (ref 1.005–1.030)
pH: 7 (ref 5.0–8.0)

## 2021-08-18 LAB — CBC WITH DIFFERENTIAL/PLATELET
Abs Immature Granulocytes: 0.03 10*3/uL (ref 0.00–0.07)
Basophils Absolute: 0.1 10*3/uL (ref 0.0–0.1)
Basophils Relative: 1 %
Eosinophils Absolute: 0.1 10*3/uL (ref 0.0–0.5)
Eosinophils Relative: 1 %
HCT: 41.9 % (ref 36.0–46.0)
Hemoglobin: 13.9 g/dL (ref 12.0–15.0)
Immature Granulocytes: 0 %
Lymphocytes Relative: 23 %
Lymphs Abs: 2.2 10*3/uL (ref 0.7–4.0)
MCH: 32.6 pg (ref 26.0–34.0)
MCHC: 33.2 g/dL (ref 30.0–36.0)
MCV: 98.1 fL (ref 80.0–100.0)
Monocytes Absolute: 0.6 10*3/uL (ref 0.1–1.0)
Monocytes Relative: 7 %
Neutro Abs: 6.5 10*3/uL (ref 1.7–7.7)
Neutrophils Relative %: 68 %
Platelets: 390 10*3/uL (ref 150–400)
RBC: 4.27 MIL/uL (ref 3.87–5.11)
RDW: 13.3 % (ref 11.5–15.5)
WBC: 9.5 10*3/uL (ref 4.0–10.5)
nRBC: 0 % (ref 0.0–0.2)

## 2021-08-18 LAB — COMPREHENSIVE METABOLIC PANEL
ALT: 14 U/L (ref 0–44)
AST: 17 U/L (ref 15–41)
Albumin: 4.3 g/dL (ref 3.5–5.0)
Alkaline Phosphatase: 53 U/L (ref 38–126)
Anion gap: 5 (ref 5–15)
BUN: 18 mg/dL (ref 6–20)
CO2: 26 mmol/L (ref 22–32)
Calcium: 9.6 mg/dL (ref 8.9–10.3)
Chloride: 109 mmol/L (ref 98–111)
Creatinine, Ser: 0.72 mg/dL (ref 0.44–1.00)
GFR, Estimated: >60 mL/min (ref >60)
Glucose, Bld: 103 mg/dL — ABNORMAL HIGH (ref 70–99)
Potassium: 4.4 mmol/L (ref 3.5–5.1)
Sodium: 140 mmol/L (ref 135–145)
Total Bilirubin: 0.8 mg/dL (ref 0.3–1.2)
Total Protein: 7.8 g/dL (ref 6.5–8.1)

## 2021-08-18 LAB — I-STAT BETA HCG BLOOD, ED (MC, WL, AP ONLY): I-stat hCG, quantitative: <5 m[IU]/mL (ref <5)

## 2021-08-18 MED ORDER — METHOCARBAMOL 500 MG PO TABS: 500.0000 mg | ORAL_TABLET | Freq: Two times a day (BID) | ORAL | 0 refills | Status: DC

## 2021-08-18 MED ORDER — LIDOCAINE 4 % EX PTCH: 1.0000 | MEDICATED_PATCH | CUTANEOUS | 0 refills | Status: DC

## 2021-08-18 MED ORDER — KETOROLAC TROMETHAMINE 30 MG/ML IJ SOLN
15.0000 mg | Freq: Once | INTRAMUSCULAR | Status: AC
  Administered 2021-08-18: 15 mg via INTRAVENOUS
  Filled 2021-08-18: qty 1

## 2021-08-18 MED ORDER — MORPHINE SULFATE (PF) 4 MG/ML IV SOLN
4.0000 mg | Freq: Once | INTRAVENOUS | Status: AC
  Administered 2021-08-18: 4 mg via INTRAVENOUS
  Filled 2021-08-18: qty 1

## 2021-08-18 MED ORDER — OXYCODONE-ACETAMINOPHEN 5-325 MG PO TABS
1.0000 | ORAL_TABLET | Freq: Once | ORAL | Status: AC
  Administered 2021-08-18: 1 via ORAL
  Filled 2021-08-18: qty 1

## 2021-08-18 MED ORDER — LIDOCAINE 5 % EX PTCH
1.0000 | MEDICATED_PATCH | Freq: Once | CUTANEOUS | Status: DC
Start: 2021-08-18 — End: 2021-08-18
  Administered 2021-08-18: 1 via TRANSDERMAL
  Filled 2021-08-18: qty 1

## 2021-08-18 MED ORDER — IBUPROFEN 600 MG PO TABS: 600.0000 mg | ORAL_TABLET | Freq: Four times a day (QID) | ORAL | 0 refills | Status: DC | PRN

## 2021-08-18 MED ORDER — ONDANSETRON HCL 4 MG/2ML IJ SOLN
4.0000 mg | Freq: Once | INTRAMUSCULAR | Status: AC
  Administered 2021-08-18: 4 mg via INTRAVENOUS
  Filled 2021-08-18: qty 2

## 2021-08-18 MED ORDER — IOHEXOL 300 MG/ML  SOLN
100.0000 mL | Freq: Once | INTRAMUSCULAR | Status: AC | PRN
  Administered 2021-08-18: 85 mL via INTRAVENOUS
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
## 2021-08-18 NOTE — ED Notes (Signed)
Patient requesting to speak with TOC again. This nurse sent secure chat to SW on duty. Also called and left SW a voicemail.

## 2021-08-18 NOTE — ED Notes (Signed)
Patient requesting more pain medications. States pain is 10/10. This nurse notified provider and primary nurse of patient request.

## 2021-08-23 ENCOUNTER — Other Ambulatory Visit: Payer: Self-pay

## 2021-08-23 ENCOUNTER — Emergency Department (HOSPITAL_COMMUNITY): Payer: Medicaid Other

## 2021-08-23 ENCOUNTER — Emergency Department (HOSPITAL_COMMUNITY)
Admission: EM | Admit: 2021-08-23 | Discharge: 2021-08-23 | Discharge status: Against Medical Advice | Payer: Medicaid Other | Attending: Emergency Medicine | Admitting: Emergency Medicine

## 2021-08-23 ENCOUNTER — Encounter (HOSPITAL_COMMUNITY): Payer: Self-pay

## 2021-08-23 DIAGNOSIS — R109 Unspecified abdominal pain: Secondary | ICD-10-CM | POA: Diagnosis present

## 2021-08-23 DIAGNOSIS — R079 Chest pain, unspecified: Secondary | ICD-10-CM | POA: Diagnosis not present

## 2021-08-23 DIAGNOSIS — R000 Tachycardia, unspecified: Secondary | ICD-10-CM | POA: Insufficient documentation

## 2021-08-23 LAB — HEPATIC FUNCTION PANEL
ALT: 12 U/L (ref 0–44)
AST: 15 U/L (ref 15–41)
Albumin: 4.2 g/dL (ref 3.5–5.0)
Alkaline Phosphatase: 64 U/L (ref 38–126)
Bilirubin, Direct: 0.1 mg/dL (ref 0.0–0.2)
Indirect Bilirubin: 1.1 mg/dL — ABNORMAL HIGH (ref 0.3–0.9)
Total Bilirubin: 1.2 mg/dL (ref 0.3–1.2)
Total Protein: 7.7 g/dL (ref 6.5–8.1)

## 2021-08-23 LAB — CBC
HCT: 39.9 % (ref 36.0–46.0)
Hemoglobin: 13.7 g/dL (ref 12.0–15.0)
MCH: 32.9 pg (ref 26.0–34.0)
MCHC: 34.3 g/dL (ref 30.0–36.0)
MCV: 95.9 fL (ref 80.0–100.0)
Platelets: 340 10*3/uL (ref 150–400)
RBC: 4.16 MIL/uL (ref 3.87–5.11)
RDW: 13 % (ref 11.5–15.5)
WBC: 13.4 10*3/uL — ABNORMAL HIGH (ref 4.0–10.5)
nRBC: 0 % (ref 0.0–0.2)

## 2021-08-23 LAB — BASIC METABOLIC PANEL
Anion gap: 7 (ref 5–15)
BUN: 12 mg/dL (ref 6–20)
CO2: 25 mmol/L (ref 22–32)
Calcium: 8.8 mg/dL — ABNORMAL LOW (ref 8.9–10.3)
Chloride: 106 mmol/L (ref 98–111)
Creatinine, Ser: 0.7 mg/dL (ref 0.44–1.00)
GFR, Estimated: >60 mL/min (ref >60)
Glucose, Bld: 113 mg/dL — ABNORMAL HIGH (ref 70–99)
Potassium: 3.6 mmol/L (ref 3.5–5.1)
Sodium: 138 mmol/L (ref 135–145)

## 2021-08-23 LAB — TROPONIN I (HIGH SENSITIVITY)
Troponin I (High Sensitivity): 3 ng/L (ref <18)
Troponin I (High Sensitivity): 2 ng/L (ref <18)

## 2021-08-23 LAB — I-STAT BETA HCG BLOOD, ED (MC, WL, AP ONLY): I-stat hCG, quantitative: <5 m[IU]/mL (ref <5)

## 2021-08-23 NOTE — ED Notes (Signed)
Pt states that she is leaving AMA and she is not going to sign the AMA form. She doesn't care what we say and she is leaving. MD notified. 

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
## 2021-08-23 NOTE — ED Notes (Signed)
Save blue and red tube and main lab

## 2021-08-29 ENCOUNTER — Other Ambulatory Visit (HOSPITAL_COMMUNITY): Payer: Self-pay | Admitting: Psychiatry

## 2021-08-29 ENCOUNTER — Telehealth (HOSPITAL_COMMUNITY): Payer: Self-pay | Admitting: *Deleted

## 2021-08-29 ENCOUNTER — Other Ambulatory Visit (HOSPITAL_COMMUNITY): Payer: Self-pay | Admitting: Physician Assistant

## 2021-08-29 DIAGNOSIS — F411 Generalized anxiety disorder: Secondary | ICD-10-CM

## 2021-08-29 MED ORDER — CLONAZEPAM 0.5 MG PO TABS: 0.5000 mg | ORAL_TABLET | Freq: Three times a day (TID) | ORAL | 0 refills | Status: DC | PRN

## 2021-08-29 NOTE — Telephone Encounter (Signed)
Medication refilled and sent to preferred pharmacy

## 2021-08-29 NOTE — Telephone Encounter (Signed)
Jill Pena  is Out of Office sending Refill Request to Dr Leonard Schwartz. Doyne Keel  Patient called LVM for Refill Request  clonazePAM (KLONOPIN) 0.5 MG tablet TAKE 1 TABLET(0.5 MG) BY MOUTH  THREE TIMES DAILY AS NEEDED FOR  ANXIETY

## 2021-09-12 ENCOUNTER — Telehealth (HOSPITAL_COMMUNITY): Payer: Self-pay | Admitting: *Deleted

## 2021-09-12 ENCOUNTER — Other Ambulatory Visit (HOSPITAL_COMMUNITY): Payer: Self-pay | Admitting: Psychiatry

## 2021-09-12 DIAGNOSIS — F411 Generalized anxiety disorder: Secondary | ICD-10-CM

## 2021-09-12 MED ORDER — CLONAZEPAM 0.5 MG PO TABS: 0.5000 mg | ORAL_TABLET | Freq: Three times a day (TID) | ORAL | 0 refills | Status: DC | PRN

## 2021-09-12 NOTE — Telephone Encounter (Signed)
Jill Pena  is Out of Office sending Refill Request to Dr Burtis Junes   Patient called LVM for Refill Request States She Only Gets 2 week Refills @ a Time   clonazePAM (KLONOPIN) 0.5 MG tablet TAKE 1 TABLET(0.5 MG) BY MOUTH  THREE TIMES DAILY AS NEEDED FOR  ANXIETY

## 2021-09-12 NOTE — Telephone Encounter (Signed)
Provider spoke to pharmacist to verify 2-week refill supply.  Pharmacist informed Clinical research associate that patient should be out of medication.  Medication refilled and sent to preferred pharmacy.

## 2021-09-13 ENCOUNTER — Emergency Department (HOSPITAL_COMMUNITY): Payer: Medicaid Other

## 2021-09-13 ENCOUNTER — Emergency Department (HOSPITAL_COMMUNITY)
Admission: EM | Admit: 2021-09-13 | Discharge: 2021-09-13 | Discharge status: Against Medical Advice | Payer: Medicaid Other | Attending: Emergency Medicine | Admitting: Emergency Medicine

## 2021-09-13 DIAGNOSIS — Z7722 Contact with and (suspected) exposure to environmental tobacco smoke (acute) (chronic): Secondary | ICD-10-CM | POA: Diagnosis not present

## 2021-09-13 DIAGNOSIS — R29810 Facial weakness: Secondary | ICD-10-CM | POA: Insufficient documentation

## 2021-09-13 DIAGNOSIS — Z5321 Procedure and treatment not carried out due to patient leaving prior to being seen by health care provider: Secondary | ICD-10-CM | POA: Insufficient documentation

## 2021-09-13 DIAGNOSIS — T7421XA Adult sexual abuse, confirmed, initial encounter: Secondary | ICD-10-CM | POA: Diagnosis present

## 2021-09-13 LAB — COMPREHENSIVE METABOLIC PANEL
ALT: 22 U/L (ref 0–44)
AST: 45 U/L — ABNORMAL HIGH (ref 15–41)
Albumin: 4.1 g/dL (ref 3.5–5.0)
Alkaline Phosphatase: 63 U/L (ref 38–126)
Anion gap: 10 (ref 5–15)
BUN: 26 mg/dL — ABNORMAL HIGH (ref 6–20)
CO2: 20 mmol/L — ABNORMAL LOW (ref 22–32)
Calcium: 9.3 mg/dL (ref 8.9–10.3)
Chloride: 108 mmol/L (ref 98–111)
Creatinine, Ser: 1.03 mg/dL — ABNORMAL HIGH (ref 0.44–1.00)
GFR, Estimated: >60 mL/min (ref >60)
Glucose, Bld: 94 mg/dL (ref 70–99)
Potassium: 3.8 mmol/L (ref 3.5–5.1)
Sodium: 138 mmol/L (ref 135–145)
Total Bilirubin: 1 mg/dL (ref 0.3–1.2)
Total Protein: 7.3 g/dL (ref 6.5–8.1)

## 2021-09-13 LAB — RAPID URINE DRUG SCREEN, HOSP PERFORMED
Amphetamines: POSITIVE — AB
Barbiturates: NOT DETECTED
Benzodiazepines: POSITIVE — AB
Cocaine: POSITIVE — AB
Opiates: NOT DETECTED
Tetrahydrocannabinol: NOT DETECTED

## 2021-09-13 LAB — CBC
HCT: 37.1 % (ref 36.0–46.0)
Hemoglobin: 12.9 g/dL (ref 12.0–15.0)
MCH: 32.4 pg (ref 26.0–34.0)
MCHC: 34.8 g/dL (ref 30.0–36.0)
MCV: 93.2 fL (ref 80.0–100.0)
Platelets: 328 10*3/uL (ref 150–400)
RBC: 3.98 MIL/uL (ref 3.87–5.11)
RDW: 12.7 % (ref 11.5–15.5)
WBC: 14.7 10*3/uL — ABNORMAL HIGH (ref 4.0–10.5)
nRBC: 0 % (ref 0.0–0.2)

## 2021-09-13 LAB — URINALYSIS, ROUTINE W REFLEX MICROSCOPIC
Bilirubin Urine: NEGATIVE
Glucose, UA: NEGATIVE mg/dL
Ketones, ur: 20 mg/dL — AB
Nitrite: NEGATIVE
Protein, ur: NEGATIVE mg/dL
Specific Gravity, Urine: 1.027 (ref 1.005–1.030)
pH: 5 (ref 5.0–8.0)

## 2021-09-13 LAB — I-STAT BETA HCG BLOOD, ED (MC, WL, AP ONLY): I-stat hCG, quantitative: <5 m[IU]/mL (ref <5)

## 2021-09-13 LAB — ETHANOL: Alcohol, Ethyl (B): <10 mg/dL (ref <10)

## 2021-09-13 MED ORDER — IOHEXOL 300 MG/ML  SOLN
100.0000 mL | Freq: Once | INTRAMUSCULAR | Status: AC | PRN
  Administered 2021-09-13: 100 mL via INTRAVENOUS

## 2021-09-13 MED ORDER — ACETAMINOPHEN 325 MG PO TABS
650.0000 mg | ORAL_TABLET | Freq: Once | ORAL | Status: AC
  Administered 2021-09-13: 650 mg via ORAL
  Filled 2021-09-13: qty 2
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
## 2021-09-13 NOTE — ED Notes (Signed)
Patient entered triage demanding additional pain medication. Patient became angry and stated "I'm going to leave AMA". Patient instructed to have a seat so her IV could be removed. Patient states "Fuck you. I'm going to shove this fucking IV down your throat. Just kidding, because that would be communicating threats." IV removed by technician Jonna without issue, patient ambulated independently with steady gait out of triage. Patient continues to shout "Bitch" and "Fuck you" while exiting department, no apparent respiratory difficulty.
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
## 2021-09-13 NOTE — ED Notes (Signed)
Patient transported to X-ray 

## 2021-09-26 ENCOUNTER — Other Ambulatory Visit (HOSPITAL_COMMUNITY): Payer: Self-pay | Admitting: Psychiatry

## 2021-09-26 ENCOUNTER — Telehealth (HOSPITAL_COMMUNITY): Payer: Self-pay | Admitting: *Deleted

## 2021-09-26 ENCOUNTER — Other Ambulatory Visit (HOSPITAL_COMMUNITY): Payer: Self-pay | Admitting: Student in an Organized Health Care Education/Training Program

## 2021-09-26 DIAGNOSIS — F411 Generalized anxiety disorder: Secondary | ICD-10-CM

## 2021-09-26 MED ORDER — CLONAZEPAM 0.5 MG PO TABS: 0.5000 mg | ORAL_TABLET | Freq: Three times a day (TID) | ORAL | 0 refills | Status: DC | PRN

## 2021-09-26 NOTE — Progress Notes (Signed)
Refill for 2 weeks supply of Klonopin 0.5mg  TID PRN placed.  PGY-3 Eliseo Gum, MD

## 2021-09-26 NOTE — Telephone Encounter (Signed)
Jill Pena  is Out of Office sending Refill Request to Dr  Antony Salmon    Patient called LVM for Refill Request States She Only Gets 2 week Refills @ a Time   clonazePAM (KLONOPIN) 0.5 MG tablet TAKE 1 TABLET(0.5 MG) BY MOUTH  THREE TIMES DAILY AS NEEDED FOR  ANXIETY

## 2021-09-26 NOTE — Telephone Encounter (Signed)
Attempted call back to hotel # she gave me LVM

## 2021-09-27 NOTE — Telephone Encounter (Signed)
Patient already had a refill ordered by Dr. Morrie Sheldon on 7/26.   Arna Snipe MD Resident

## 2021-10-03 ENCOUNTER — Other Ambulatory Visit (HOSPITAL_COMMUNITY): Payer: Self-pay | Admitting: Student in an Organized Health Care Education/Training Program

## 2021-10-03 ENCOUNTER — Telehealth (HOSPITAL_COMMUNITY): Payer: Self-pay | Admitting: *Deleted

## 2021-10-03 DIAGNOSIS — F411 Generalized anxiety disorder: Secondary | ICD-10-CM

## 2021-10-03 MED ORDER — CLONAZEPAM 0.5 MG PO TABS: 0.5000 mg | ORAL_TABLET | Freq: Three times a day (TID) | ORAL | 0 refills | Status: DC | PRN

## 2021-10-03 NOTE — Progress Notes (Signed)
2 week refill sent in for patient, last prescription was only enough for 1 week as patient is using the medication the max TID amount.    PGY-3 Eliseo Gum, MD

## 2021-10-03 NOTE — Telephone Encounter (Signed)
PATIENT CALLED STATED THAT PROVIDER NOW HAS HER ON WEEKLY REFILLS FOR  -- clonazePAM (KLONOPIN) 0.5 MG tablet Take 1 tablet (0.5 mg total) by mouth 3 (three) times daily as needed for anxiety. Take one tablet (0.5 mg) three times daily as needed    

## 2021-10-09 ENCOUNTER — Encounter (HOSPITAL_COMMUNITY): Payer: Self-pay | Admitting: Physician Assistant

## 2021-10-09 ENCOUNTER — Ambulatory Visit (INDEPENDENT_AMBULATORY_CARE_PROVIDER_SITE_OTHER): Payer: Medicaid Other | Admitting: Physician Assistant

## 2021-10-09 DIAGNOSIS — F411 Generalized anxiety disorder: Secondary | ICD-10-CM | POA: Diagnosis not present

## 2021-10-09 DIAGNOSIS — F331 Major depressive disorder, recurrent, moderate: Secondary | ICD-10-CM | POA: Diagnosis not present

## 2021-10-09 MED ORDER — BUSPIRONE HCL 7.5 MG PO TABS
7.5000 mg | ORAL_TABLET | Freq: Two times a day (BID) | ORAL | 2 refills | Status: DC
Start: 2021-10-09 — End: 2022-02-22

## 2021-10-09 MED ORDER — CLONAZEPAM 0.5 MG PO TABS: 0.5000 mg | ORAL_TABLET | Freq: Two times a day (BID) | ORAL | 0 refills | Status: DC | PRN

## 2021-10-09 MED ORDER — HYDROXYZINE HCL 25 MG PO TABS
25.0000 mg | ORAL_TABLET | Freq: Three times a day (TID) | ORAL | 2 refills | Status: DC | PRN
Start: 2021-10-09 — End: 2022-02-22

## 2021-10-09 MED ORDER — ESCITALOPRAM OXALATE 10 MG PO TABS: 10.0000 mg | ORAL_TABLET | Freq: Every day | ORAL | 2 refills | Status: DC

## 2021-10-09 NOTE — Progress Notes (Unsigned)
BH MD/PA/NP OP Progress Note  10/09/2021 3:10 PM Jill Pena  MRN:  [id number]  Chief Complaint:  Chief Complaint  Patient presents with   Medication Management   HPI:   Jill Pena is a 46 year old female with a past psychiatric history significant for generalized anxiety disorder and major depressive disorder who presents to [facility] for follow-up and medication management.  Patient is currently being managed on the following medications:  Clonazepam 0.5 mg 3 times daily as needed Hydroxyzine 25 mg 3 times daily as needed Escitalopram 10 mg daily Buspirone 7.5 mg 2 times daily  Patient reports no issues or concerns regarding her current medication regimen.  Since being placed on her current medication regimen, patient states that she has been able to get closer to her normal weight and has been able to get more sleep.  Patient denies experiencing depression or anxiety and further denies any new stressors at this time.  Patient denies any issues regarding her mental health.  Patient is alert and oriented x4, pleasant, calm, cooperative, and fully engaged in conversation during the encounter.  Patient endorses good mood and states that she is grateful and happy.  Patient denies suicidal or homicidal ideations.  She further denies auditory or visual hallucinations and does not appear to be responding to internal/external stimuli.  Patient endorses good sleep and receives on average 8 to 11 hours of sleep each night.  Patient endorses good appetite and eats on average 1 big meal and 2 small meals per day.  Patient denies alcohol consumption and illicit drug use.  Patient endorses tobacco use and smokes on average 6 to 10 cigarettes/day.  Visit Diagnosis:    ICD-10-CM   1. GAD (generalized anxiety disorder)  F41.1 busPIRone (BUSPAR) 7.5 MG tablet    hydrOXYzine (ATARAX) 25 MG tablet    escitalopram (LEXAPRO) 10 MG tablet    clonazePAM  (KLONOPIN) 0.5 MG tablet    2. Major depressive disorder, recurrent episode, moderate (HCC)  F33.1 escitalopram (LEXAPRO) 10 MG tablet      Past Psychiatric History:  Generalized anxiety disorder Major depressive disorder  Past Medical History:  Past Medical History:  Diagnosis Date   Anxiety    Genital herpes 2022    Past Surgical History:  Procedure Laterality Date   FEMUR SURGERY     TIBIA FRACTURE SURGERY      Family Psychiatric History:  Mother - anxiety/panic attacks Grandmother (maternal) - OCD  Family History:  Family History  Problem Relation Age of Onset   Anxiety disorder Mother     Social History:  Social History   Socioeconomic History   Marital status: Divorced    Spouse name: Not on file   Number of children: Not on file   Years of education: Not on file   Highest education level: Not on file  Occupational History   Not on file  Tobacco Use   Smoking status: Every Day    Packs/day: 1.00    Types: Cigarettes   Smokeless tobacco: Never  Substance and Sexual Activity   Alcohol use: Not Currently   Drug use: Not Currently   Sexual activity: Yes    Partners: Male    Birth control/protection: None  Other Topics Concern   Not on file  Social History Narrative   Not on file   Social Determinants of Health   Financial Resource Strain: High Risk (06/18/2021)   Overall Financial Resource Strain (CARDIA)    Difficulty of  Paying Living Expenses: Very hard  Food Insecurity: Food Insecurity Present (06/18/2021)   Hunger Vital Sign    Worried About Running Out of Food in the Last Year: Often true    Ran Out of Food in the Last Year: Often true  Transportation Needs: Unmet Transportation Needs (06/18/2021)   PRAPARE - Administrator, Civil Service (Medical): Yes    Lack of Transportation (Non-Medical): Yes  Physical Activity: Inactive (06/18/2021)   Exercise Vital Sign    Days of Exercise per Week: 0 days    Minutes of Exercise per  Session: 0 min  Stress: Stress Concern Present (06/18/2021)   [employer] of Occupational Health - Occupational Stress Questionnaire    Feeling of Stress : Very much  Social Connections: Socially Isolated (06/18/2021)   Social Connection and Isolation Panel [NHANES]    Frequency of Communication with Friends and Family: Once a week    Frequency of Social Gatherings with Friends and Family: Once a week    Attends Religious Services: Never    Database administrator or Organizations: No    Attends Engineer, structural: Never    Marital Status: Divorced    Allergies: No Known Allergies  Metabolic Disorder Labs: No results found for: "HGBA1C", "MPG" No results found for: "PROLACTIN" No results found for: "CHOL", "TRIG", "HDL", "CHOLHDL", "VLDL", "LDLCALC" No results found for: "TSH"  Therapeutic Level Labs: No results found for: "LITHIUM" No results found for: "VALPROATE" No results found for: "CBMZ"  Current Medications: Current Outpatient Medications  Medication Sig Dispense Refill   ibuprofen (ADVIL) 600 MG tablet Take 1 tablet (600 mg total) by mouth every 6 (six) hours as needed. 30 tablet 0   lidocaine (SALONPAS PAIN RELIEVING) 4 % Place 1 patch onto the skin daily. 15 patch 0   methocarbamol (ROBAXIN) 500 MG tablet Take 1 tablet (500 mg total) by mouth 2 (two) times daily. (Patient taking differently: Take 500 mg by mouth daily as needed for muscle spasms.) 20 tablet 0   busPIRone (BUSPAR) 7.5 MG tablet Take 1 tablet (7.5 mg total) by mouth 2 (two) times daily. 60 tablet 2   [START ON 10/17/2021] clonazePAM (KLONOPIN) 0.5 MG tablet Take 1 tablet (0.5 mg total) by mouth 2 (two) times daily as needed for anxiety. 60 tablet 0   escitalopram (LEXAPRO) 10 MG tablet Take 1 tablet (10 mg total) by mouth daily. 30 tablet 2   hydrOXYzine (ATARAX) 25 MG tablet Take 1 tablet (25 mg total) by mouth 3 (three) times daily as needed for anxiety. 75 tablet 2   No current  facility-administered medications for this visit.     Musculoskeletal: Strength & Muscle Tone: within normal limits Gait & Station: normal Patient leans: N/A  Psychiatric Specialty Exam: Review of Systems  Psychiatric/Behavioral:  Negative for decreased concentration, dysphoric mood, hallucinations, self-injury, sleep disturbance and suicidal ideas. The patient is not nervous/anxious and is not hyperactive.     Blood pressure 111/71, pulse 73, height 5\' 3"  (1.6 m), weight 152 lb (68.9 kg), unknown if currently breastfeeding.Body mass index is 26.93 kg/m.  General Appearance: Casual  Eye Contact:  Good  Speech:  Clear and Coherent and Normal Rate  Volume:  Normal  Mood:  Euthymic  Affect:  Appropriate  Thought Process:  Coherent and Descriptions of Associations: Intact  Orientation:  Full (Time, Place, and Person)  Thought Content: WDL   Suicidal Thoughts:  No  Homicidal Thoughts:  No  Memory:  Immediate;  Good Recent;   Good Remote;   Fair  Judgement:  Fair  Insight:  Fair  Psychomotor Activity:  Normal  Concentration:  Concentration: Good and Attention Span: Good  Recall:  Fair  Fund of Knowledge: Good  Language: Good  Akathisia:  No  Handed:  Left  AIMS (if indicated): not done  Assets:  Communication Skills Desire for Improvement Housing Social Support  ADL's:  Intact  Cognition: WNL  Sleep:  Good   Screenings: GAD-7    Flowsheet Row Clinical Support from 10/09/2021 in [facility] Office Visit from 08/07/2021 in [facility] Office Visit from 06/20/2021 in [facility] Counselor from 06/18/2021 in [facility]  Total GAD-7 Score 0 0 17 19      PHQ2-9    Flowsheet Row Clinical Support from 10/09/2021 in [facility] Office Visit from 08/07/2021 in [facility] Office Visit from 06/20/2021 in [facility] Counselor from 06/18/2021 in [facility]  PHQ-2 Total Score 0 0 5 5  PHQ-9 Total Score -- -- 20 23      Flowsheet Row Clinical Support from 10/09/2021 in [facility] ED from 09/13/2021 in [facility] EMERGENCY DEPARTMENT ED from 08/23/2021 in [facility]  C-SSRS RISK CATEGORY No Risk No Risk No Risk        Assessment and Plan:   Jill Pena is a 46 year old female with a past psychiatric history significant for generalized anxiety disorder and major depressive disorder who presents to [facility] for follow-up and medication management.  Patient reports no issues or concerns regarding her current medication regimen.  Patient denies experiencing any depressive symptoms or anxiety and appears stable on her current medication regimen.  Patient's clonazepam to be adjusted from 0.5 mg 3 times daily as needed to 0.5 mg 2 times daily as needed for the management of her anxiety.  Patient to continue taking all other medications as prescribed.  Patient's medications to be e-prescribed to pharmacy of choice.  Collaboration of Care: Collaboration of Care: Medication Management AEB provider managing patient's psychiatric medications and Psychiatrist AEB patient being followed by mental health provider  Patient/Guardian was advised Release of Information must be obtained prior to any record release in order to collaborate their care with an outside provider. Patient/Guardian was advised if they have not already done so to contact the registration department to sign all necessary forms in order for [place] to release information regarding their care.   Consent: Patient/Guardian gives verbal consent for treatment and assignment of benefits for services provided during this visit. Patient/Guardian expressed understanding and agreed to  proceed.   1. GAD (generalized anxiety disorder)  - busPIRone (BUSPAR) 7.5 MG tablet; Take 1 tablet (7.5 mg total) by mouth 2 (two) times daily.  Dispense: 60 tablet; Refill: 2 - hydrOXYzine (ATARAX) 25 MG tablet; Take 1 tablet (25 mg total) by mouth 3 (three) times daily as needed for anxiety.  Dispense: 75 tablet; Refill: 2 - escitalopram (LEXAPRO) 10 MG tablet; Take 1 tablet (10 mg total) by mouth daily.  Dispense: 30 tablet; Refill: 2 - clonazePAM (KLONOPIN) 0.5 MG tablet; Take 1 tablet (0.5 mg total) by mouth 2 (two) times daily as needed for anxiety.  Dispense: 60 tablet; Refill: 0  2. Major depressive disorder, recurrent episode, moderate (HCC)  - escitalopram (LEXAPRO)  10 MG tablet; Take 1 tablet (10 mg total) by mouth daily.  Dispense: 30 tablet; Refill: 2  Patient to follow up in 3 months Provider spent a total of 10 minutes with the patient/reviewing patient's chart  Meta Hatchet, PA 10/09/2021, 3:10 PM

## 2021-10-10 ENCOUNTER — Encounter (HOSPITAL_COMMUNITY): Payer: Self-pay | Admitting: Physician Assistant

## 2021-11-16 ENCOUNTER — Other Ambulatory Visit (HOSPITAL_COMMUNITY): Payer: Self-pay | Admitting: Psychiatry

## 2021-11-16 ENCOUNTER — Telehealth (HOSPITAL_COMMUNITY): Payer: Self-pay

## 2021-11-16 DIAGNOSIS — F411 Generalized anxiety disorder: Secondary | ICD-10-CM

## 2021-11-16 MED ORDER — CLONAZEPAM 0.5 MG PO TABS: 0.5000 mg | ORAL_TABLET | Freq: Two times a day (BID) | ORAL | 0 refills | Status: DC | PRN

## 2021-11-16 NOTE — Telephone Encounter (Signed)
Medication refilled and sent to preferred pharmacy

## 2021-12-14 ENCOUNTER — Other Ambulatory Visit (HOSPITAL_COMMUNITY): Payer: Self-pay | Admitting: Psychiatry

## 2021-12-14 ENCOUNTER — Telehealth (HOSPITAL_COMMUNITY): Payer: Self-pay | Admitting: *Deleted

## 2021-12-14 DIAGNOSIS — F411 Generalized anxiety disorder: Secondary | ICD-10-CM

## 2021-12-14 NOTE — Telephone Encounter (Signed)
Please address this concern to [facility].

## 2021-12-14 NOTE — Telephone Encounter (Signed)
PATIENT CALLED STATED THAT PROVIDER NOW HAS HER ON WEEKLY REFILLS FOR  -- clonazePAM (KLONOPIN) 0.5 MG tablet Take 1 tablet (0.5 mg total) by mouth 3 (three) times daily as needed for anxiety. Take one tablet (0.5 mg) three times daily as needed

## 2021-12-17 ENCOUNTER — Other Ambulatory Visit (HOSPITAL_COMMUNITY): Payer: Self-pay | Admitting: Physician Assistant

## 2021-12-17 ENCOUNTER — Telehealth (HOSPITAL_COMMUNITY): Payer: Self-pay | Admitting: *Deleted

## 2021-12-17 DIAGNOSIS — F411 Generalized anxiety disorder: Secondary | ICD-10-CM

## 2021-12-17 MED ORDER — CLONAZEPAM 0.5 MG PO TABS
0.5000 mg | ORAL_TABLET | Freq: Two times a day (BID) | ORAL | 1 refills | Status: DC | PRN
Start: 2021-12-17 — End: 2022-02-22

## 2021-12-17 NOTE — Telephone Encounter (Signed)
I am forwarding this, because the patient has not been placed under a new provider. The patient's last prescription and dose recommendations were 0.5mg  BID PRN.

## 2021-12-17 NOTE — Progress Notes (Signed)
Provider was contacted by Jill Pena, RMA regarding patient's medication. Patient's medication to be e-prescribed to pharmacy of choice.

## 2021-12-17 NOTE — Telephone Encounter (Signed)
Provider was contacted by Direce E. McIntyre, RMA regarding patient's medication. Patient's medication to be e-prescribed to pharmacy of choice. 

## 2021-12-17 NOTE — Telephone Encounter (Signed)
Your Patient hasn't been assigned a forwarding provider & she's calling The patient's last prescription and dose recommendations were 0.5mg  BID PRN.ncerning her medication --

## 2022-01-08 ENCOUNTER — Encounter (HOSPITAL_COMMUNITY): Payer: Medicaid Other | Admitting: Physician Assistant

## 2022-01-15 ENCOUNTER — Encounter (HOSPITAL_COMMUNITY): Payer: Medicaid Other | Admitting: Psychiatry

## 2022-01-16 ENCOUNTER — Telehealth (HOSPITAL_COMMUNITY): Payer: Self-pay | Admitting: *Deleted

## 2022-01-16 NOTE — Telephone Encounter (Signed)
PATIENT HAS CALLED REQUESTING REFILL--  PATIENT HAS APPT WITH NEW PROVIDER   DR Precious Haws ON 01/21/22  clonazepam (KLONOPIN) 0.5 MG tablet Take 1 tablet (0.5 mg total) by mouth 3 (three) times daily as needed for anxiety. Take one tablet (0.5 mg) three times daily as needed

## 2022-01-20 NOTE — Progress Notes (Unsigned)
Patient did not connect for virtual psychiatric medication management appointment on 01/21/22 at 9:30AM. Called phone with no answer; left VM with callback number to reschedule.  Daine Gip, MD 01/21/22

## 2022-01-21 ENCOUNTER — Encounter (HOSPITAL_COMMUNITY): Payer: Self-pay

## 2022-01-21 ENCOUNTER — Encounter (HOSPITAL_COMMUNITY): Payer: Medicaid Other | Admitting: Psychiatry

## 2022-02-04 NOTE — Progress Notes (Deleted)
BH MD Outpatient Progress Note  02/04/2022 5:16 PM Jill Pena  MRN:  [id number]  Assessment:  Jill Pena presents for follow-up evaluation. Today, 02/04/22, patient reports ***  Identifying Information: Jill Pena is a 46 y.o. female with a history of MDD and GAD *** who is an established patient with [facility] participating in follow-up via video conferencing.   Plan:  # MDD  GAD Past medication trials:  Status of problem: *** Interventions: -- *** Lexapro 10 mg daily Buspirone 7.5 mg BID Klonopin 0.5 mg BID PRN anxiety Atarax 25 mg TID PRN  # *** Past medication trials:  Status of problem: *** Interventions: -- ***  # *** Past medication trials:  Status of problem: *** Interventions: -- ***  Patient was given contact information for behavioral health clinic and was instructed to call 911 for emergencies.   Subjective:  Chief Complaint: No chief complaint on file.   Interval History: ***   Patient last seen by Otila Back, PA on 10/09/2021.  At that time, managed on: Klonopin 0.5 mg TID PRN anxiety Atarax 25 mg TID PRN Lexapro 10 mg daily Buspirone 7.5 mg BID During that visit, patient reported overall stability with improvement in mood and anxiety.  Klonopin was thus decreased to 0.5 mg BID PRN.   Mood, anxiety Klonopin use: plan for ongoing taper (0.25 mg BID or 0.5 daily) Atarax use SI, HI AVH   Consider lexapro to 20 or Buspar to 15 BID or 7.5 TID  PDMP: -- Klonopin 1 mg QTY 30 last filled 01/16/22 (regular rx dating back to June 2022)  Visit Diagnosis: No diagnosis found.  Past Psychiatric History:  Diagnoses: ***MDD, GAD Medication trials: *** Previous psychiatrist/therapist: *** Hospitalizations: *** Suicide attempts: *** SIB: *** Hx of violence towards others: *** Current access to guns: *** Hx of abuse: *** Substance use: ***  -- Tobacco: 6 to 10 cigarettes/day  -- Denies alcohol or illicit  drug use  Past Medical History:  Past Medical History:  Diagnosis Date   Anxiety    Genital herpes 2022    Past Surgical History:  Procedure Laterality Date   FEMUR SURGERY     TIBIA FRACTURE SURGERY      Family Psychiatric History: *** Mother - anxiety/panic attacks Maternal grandmother - OCD  Family History:  Family History  Problem Relation Age of Onset   Anxiety disorder Mother     Social History:  Social History   Socioeconomic History   Marital status: Divorced    Spouse name: Not on file   Number of children: Not on file   Years of education: Not on file   Highest education level: Not on file  Occupational History   Not on file  Tobacco Use   Smoking status: Every Day    Packs/day: 1.00    Types: Cigarettes   Smokeless tobacco: Never  Substance and Sexual Activity   Alcohol use: Not Currently   Drug use: Not Currently   Sexual activity: Yes    Partners: Male    Birth control/protection: None  Other Topics Concern   Not on file  Social History Narrative   Not on file   Social Determinants of Health   Financial Resource Strain: High Risk (06/18/2021)   Overall Financial Resource Strain (CARDIA)    Difficulty of Paying Living Expenses: Very hard  Food Insecurity: Food Insecurity Present (06/18/2021)   Hunger Vital Sign    Worried About Programme researcher, broadcasting/film/video in  the Last Year: Often true    Ran Out of Food in the Last Year: Often true  Transportation Needs: Unmet Transportation Needs (06/18/2021)   PRAPARE - Administrator, Civil Service (Medical): Yes    Lack of Transportation (Non-Medical): Yes  Physical Activity: Inactive (06/18/2021)   Exercise Vital Sign    Days of Exercise per Week: 0 days    Minutes of Exercise per Session: 0 min  Stress: Stress Concern Present (06/18/2021)   [employer] of Occupational Health - Occupational Stress Questionnaire    Feeling of Stress : Very much  Social Connections: Socially Isolated (06/18/2021)    Social Connection and Isolation Panel [NHANES]    Frequency of Communication with Friends and Family: Once a week    Frequency of Social Gatherings with Friends and Family: Once a week    Attends Religious Services: Never    Database administrator or Organizations: No    Attends Engineer, structural: Never    Marital Status: Divorced    Allergies: No Known Allergies  Current Medications: Current Outpatient Medications  Medication Sig Dispense Refill   busPIRone (BUSPAR) 7.5 MG tablet Take 1 tablet (7.5 mg total) by mouth 2 (two) times daily. 60 tablet 2   clonazePAM (KLONOPIN) 0.5 MG tablet Take 1 tablet (0.5 mg total) by mouth 2 (two) times daily as needed for anxiety. 60 tablet 1   escitalopram (LEXAPRO) 10 MG tablet Take 1 tablet (10 mg total) by mouth daily. 30 tablet 2   hydrOXYzine (ATARAX) 25 MG tablet Take 1 tablet (25 mg total) by mouth 3 (three) times daily as needed for anxiety. 75 tablet 2   ibuprofen (ADVIL) 600 MG tablet Take 1 tablet (600 mg total) by mouth every 6 (six) hours as needed. 30 tablet 0   lidocaine (SALONPAS PAIN RELIEVING) 4 % Place 1 patch onto the skin daily. 15 patch 0   methocarbamol (ROBAXIN) 500 MG tablet Take 1 tablet (500 mg total) by mouth 2 (two) times daily. (Patient taking differently: Take 500 mg by mouth daily as needed for muscle spasms.) 20 tablet 0   No current facility-administered medications for this visit.    ROS: Review of Systems  Objective:  Psychiatric Specialty Exam: unknown if currently breastfeeding.There is no height or weight on file to calculate BMI.  General Appearance: {Appearance:22683}  Eye Contact:  {BHH EYE CONTACT:22684}  Speech:  {Speech:22685}  Volume:  {Volume (PAA):22686}  Mood:  {BHH MOOD:22306}  Affect:  {Affect (PAA):22687}  Thought Content: {Thought Content:22690}   Suicidal Thoughts:  {ST/HT (PAA):22692}  Homicidal Thoughts:  {ST/HT (PAA):22692}  Thought Process:  {Thought Process  (PAA):22688}  Orientation:  {BHH ORIENTATION (PAA):22689}    Memory:  {BHH MEMORY:22881}  Judgment:  {Judgement (PAA):22694}  Insight:  {Insight (PAA):22695}  Concentration:  {Concentration:21399}  Recall:  {BHH GOOD/FAIR/POOR:22877}  Fund of Knowledge: {BHH GOOD/FAIR/POOR:22877}  Language: {BHH GOOD/FAIR/POOR:22877}  Psychomotor Activity:  {Psychomotor (PAA):22696}  Akathisia:  {BHH YES OR NO:22294}  AIMS (if indicated): {Desc; done/not:10129}  Assets:  {Assets (PAA):22698}  ADL's:  {BHH PPI'R:51884}  Cognition: {chl bhh cognition:304700322}  Sleep:  {BHH GOOD/FAIR/POOR:22877}   PE: General: sits comfortably in view of camera; no acute distress *** Pulm: no increased work of breathing on room air *** MSK: all extremity movements appear intact *** Neuro: no focal neurological deficits observed *** Gait & Station: unable to assess by video ***   Metabolic Disorder Labs: No results found for: "HGBA1C", "MPG" No results found for: "  PROLACTIN" No results found for: "CHOL", "TRIG", "HDL", "CHOLHDL", "VLDL", "LDLCALC" No results found for: "TSH"  Therapeutic Level Labs: No results found for: "LITHIUM" No results found for: "VALPROATE" No results found for: "CBMZ"  Screenings:  GAD-7    Flowsheet Row Office Visit from 10/09/2021 in [facility] Office Visit from 08/07/2021 in [facility] Office Visit from 06/20/2021 in [facility] Counselor from 06/18/2021 in [facility]  Total GAD-7 Score 0 0 17 19      PHQ2-9    Flowsheet Row Office Visit from 10/09/2021 in [facility] Office Visit from 08/07/2021 in [facility] Office Visit from 06/20/2021 in [facility] Counselor from 06/18/2021 in [facility]  PHQ-2 Total Score 0 0 5 5  PHQ-9 Total Score -- -- 20 23       Flowsheet Row Office Visit from 10/09/2021 in [facility] ED from 09/13/2021 in [facility] EMERGENCY DEPARTMENT ED from 08/23/2021 in [facility]  C-SSRS RISK CATEGORY No Risk No Risk No Risk       Collaboration of Care: Collaboration of Care: {BH OP Collaboration of Care:21014065}  Patient/Guardian was advised Release of Information must be obtained prior to any record release in order to collaborate their care with an outside provider. Patient/Guardian was advised if they have not already done so to contact the registration department to sign all necessary forms in order for [place] to release information regarding their care.   Consent: Patient/Guardian gives verbal consent for treatment and assignment of benefits for services provided during this visit. Patient/Guardian expressed understanding and agreed to proceed.   Televisit via video: I connected with patient on 02/04/22 at  9:30 AM EST by a video enabled telemedicine application and verified that I am speaking with the correct person using two identifiers.  Location: Patient: *** Provider: clinic   I discussed the limitations of evaluation and management by telemedicine and the availability of in person appointments. The patient expressed understanding and agreed to proceed.  I discussed the assessment and treatment plan with the patient. The patient was provided an opportunity to ask questions and all were answered. The patient agreed with the plan and demonstrated an understanding of the instructions.   The patient was advised to call back or seek an in-person evaluation if the symptoms worsen or if the condition fails to improve as anticipated.  I provided *** minutes of non-face-to-face time during this encounter.  Cristofher Livecchi A  02/04/2022, 5:16 PM

## 2022-02-04 NOTE — Patient Instructions (Incomplete)
Thank you for attending your appointment today.  -- *** -- Continue other medications as prescribed.  Please do not make any changes to medications without first discussing with your provider. If you are experiencing a psychiatric emergency, please call 911 or present to your nearest emergency department. Additional crisis, medication management, and therapy resources are included below.  [facility]  [address], [place], [place] [address] [phone] WALK-IN URGENT CARE 24/7 FOR ANYONE [address], [place], [place]  [phone] Fax: [phone] [email or web address] *Interpreters available *Accepts all insurance and uninsured for Urgent Care needs *Accepts Medicaid and uninsured for outpatient treatment (below)      ONLY FOR [place] Residents  Below:    Outpatient New Patient Assessment/Therapy Walk-ins:        Monday -Thursday 8am until slots are full.        Every Friday 1pm-4pm  (first come, first served)                   New Patient Psychiatry/Medication Management        Monday-Friday 8am-11am (first come, first served)               For all walk-ins we ask that you arrive by 7:15am, because patients will be seen in the order of arrival.   

## 2022-02-05 ENCOUNTER — Telehealth (HOSPITAL_COMMUNITY): Payer: Medicaid Other | Admitting: Psychiatry

## 2022-02-18 ENCOUNTER — Other Ambulatory Visit (HOSPITAL_COMMUNITY): Payer: Self-pay | Admitting: Physician Assistant

## 2022-02-18 DIAGNOSIS — F411 Generalized anxiety disorder: Secondary | ICD-10-CM

## 2022-02-22 ENCOUNTER — Telehealth (HOSPITAL_COMMUNITY): Payer: Self-pay | Admitting: Psychiatry

## 2022-02-22 DIAGNOSIS — F411 Generalized anxiety disorder: Secondary | ICD-10-CM

## 2022-02-22 DIAGNOSIS — F331 Major depressive disorder, recurrent, moderate: Secondary | ICD-10-CM

## 2022-02-22 MED ORDER — ESCITALOPRAM OXALATE 10 MG PO TABS: 10.0000 mg | ORAL_TABLET | Freq: Every day | ORAL | 0 refills | Status: DC

## 2022-02-22 MED ORDER — HYDROXYZINE HCL 25 MG PO TABS: 25.0000 mg | ORAL_TABLET | Freq: Three times a day (TID) | ORAL | 0 refills | Status: DC | PRN

## 2022-02-22 MED ORDER — BUSPIRONE HCL 7.5 MG PO TABS: 7.5000 mg | ORAL_TABLET | Freq: Two times a day (BID) | ORAL | 0 refills | Status: DC

## 2022-02-22 MED ORDER — CLONAZEPAM 0.5 MG PO TABS: 0.5000 mg | ORAL_TABLET | Freq: Two times a day (BID) | ORAL | 0 refills | Status: DC | PRN

## 2022-02-22 NOTE — Telephone Encounter (Signed)
Thirty days bridge supply is given by Covering MD. Patient should see provider for future refills.. 

## 2022-02-22 NOTE — Telephone Encounter (Signed)
This patient is requesting refill on Prescriptions to bridge until her Appt on 03/08/22.

## 2022-03-08 ENCOUNTER — Encounter (HOSPITAL_COMMUNITY): Payer: Medicaid Other | Admitting: Psychiatry

## 2022-03-29 ENCOUNTER — Ambulatory Visit (INDEPENDENT_AMBULATORY_CARE_PROVIDER_SITE_OTHER): Payer: Medicaid Other | Admitting: Physician Assistant

## 2022-03-29 ENCOUNTER — Encounter (HOSPITAL_COMMUNITY): Payer: Self-pay | Admitting: Physician Assistant

## 2022-03-29 DIAGNOSIS — F411 Generalized anxiety disorder: Secondary | ICD-10-CM | POA: Diagnosis not present

## 2022-03-29 DIAGNOSIS — F330 Major depressive disorder, recurrent, mild: Secondary | ICD-10-CM

## 2022-03-29 MED ORDER — ESCITALOPRAM OXALATE 10 MG PO TABS: 10.0000 mg | ORAL_TABLET | Freq: Every day | ORAL | 1 refills | Status: DC

## 2022-03-29 MED ORDER — BUSPIRONE HCL 7.5 MG PO TABS: 7.5000 mg | ORAL_TABLET | Freq: Two times a day (BID) | ORAL | 1 refills | Status: DC

## 2022-03-29 MED ORDER — CLONAZEPAM 0.5 MG PO TABS
0.5000 mg | ORAL_TABLET | Freq: Two times a day (BID) | ORAL | 1 refills | Status: DC | PRN
Start: 2022-03-29 — End: 2022-05-24

## 2022-03-29 NOTE — Progress Notes (Signed)
BH MD/PA/NP OP Progress Note  03/29/2022 12:38 PM Jill Pena  MRN:  [id number]  Chief Complaint:  Chief Complaint  Patient presents with   Follow-up   Medication Refill   HPI:   Jill Pena is a 47 year old, Caucasian female with a past psychiatric history significant for generalized anxiety disorder and major depressive disorder who presents to [facility] for follow up and medication management. Patient was last seen by this provider on 10/09/2021. During her last encounter, patient was being managed on the following psychiatric medications:  Buspirone 7.5 mg 2 times daily Hydroxyzine 25 mg 3 times daily as needed Escitalopram 10 mg daily Clonazepam 0.5 mg 2 times daily as needed  Patient reports that she is doing well and is mentally okay.  Patient denies experiencing any adverse side effects from her current medication regimen.  Patient denies depression at this time.  Patient endorses minimal and rates her anxiety a 1 out of 10.  Patient states that the level of her anxiety is dependent on what is going on around her.  Patient denies any new stressors at this time.  Patient is alert and oriented x 4, calm, cooperative, and fully engaged in conversation during the encounter.  Patient endorses okay mood.  Patient denies suicidal or homicidal ideations.  She further denies auditory or visual hallucinations and does not appear to be responding to internal/external stimuli.  Patient endorses good sleep and receives on average 6 to 9 hours of sleep each night.  Patient endorses good appetite and eats on average 3 meals per day as well as snacking in between.  Patient denies alcohol consumption.  Patient endorses tobacco use and smokes on average 1/2 pack/day.  Patient denies illicit drug use.  Visit Diagnosis:    ICD-10-CM   1. GAD (generalized anxiety disorder)  F41.1 clonazePAM (KLONOPIN) 0.5 MG tablet    escitalopram (LEXAPRO) 10 MG tablet     busPIRone (BUSPAR) 7.5 MG tablet    2. Mild episode of recurrent major depressive disorder (HCC)  F33.0 escitalopram (LEXAPRO) 10 MG tablet      Past Psychiatric History:  Generalized anxiety disorder Major depressive disorder   Past Medical History:  Past Medical History:  Diagnosis Date   Anxiety    Genital herpes 2022    Past Surgical History:  Procedure Laterality Date   FEMUR SURGERY     TIBIA FRACTURE SURGERY      Family Psychiatric History:  Mother - anxiety/panic attacks Grandmother (maternal) - OCD  Family History:  Family History  Problem Relation Age of Onset   Anxiety disorder Mother     Social History:  Social History   Socioeconomic History   Marital status: Divorced    Spouse name: Not on file   Number of children: Not on file   Years of education: Not on file   Highest education level: Not on file  Occupational History   Not on file  Tobacco Use   Smoking status: Every Day    Packs/day: 1.00    Types: Cigarettes   Smokeless tobacco: Never  Substance and Sexual Activity   Alcohol use: Not Currently   Drug use: Not Currently   Sexual activity: Yes    Partners: Male    Birth control/protection: None  Other Topics Concern   Not on file  Social History Narrative   Not on file   Social Determinants of Health   Financial Resource Strain: High Risk (06/18/2021)   Overall  Financial Resource Strain (CARDIA)    Difficulty of Paying Living Expenses: Very hard  Food Insecurity: Food Insecurity Present (06/18/2021)   Hunger Vital Sign    Worried About Running Out of Food in the Last Year: Often true    Ran Out of Food in the Last Year: Often true  Transportation Needs: Unmet Transportation Needs (06/18/2021)   PRAPARE - Hydrologist (Medical): Yes    Lack of Transportation (Non-Medical): Yes  Physical Activity: Inactive (06/18/2021)   Exercise Vital Sign    Days of Exercise per Week: 0 days    Minutes of Exercise  per Session: 0 min  Stress: Stress Concern Present (06/18/2021)   [place]    Feeling of Stress : Very much  Social Connections: Socially Isolated (06/18/2021)   Social Connection and Isolation Panel [NHANES]    Frequency of Communication with Friends and Family: Once a week    Frequency of Social Gatherings with Friends and Family: Once a week    Attends Religious Services: Never    Marine scientist or Organizations: No    Attends Music therapist: Never    Marital Status: Divorced    Allergies: No Known Allergies  Metabolic Disorder Labs: No results found for: "HGBA1C", "MPG" No results found for: "PROLACTIN" No results found for: "CHOL", "TRIG", "HDL", "CHOLHDL", "VLDL", "LDLCALC" No results found for: "TSH"  Therapeutic Level Labs: No results found for: "LITHIUM" No results found for: "VALPROATE" No results found for: "CBMZ"  Current Medications: Current Outpatient Medications  Medication Sig Dispense Refill   busPIRone (BUSPAR) 7.5 MG tablet Take 1 tablet (7.5 mg total) by mouth 2 (two) times daily. 60 tablet 1   clonazePAM (KLONOPIN) 0.5 MG tablet Take 1 tablet (0.5 mg total) by mouth 2 (two) times daily as needed for anxiety. 60 tablet 1   escitalopram (LEXAPRO) 10 MG tablet Take 1 tablet (10 mg total) by mouth daily. 30 tablet 1   hydrOXYzine (ATARAX) 25 MG tablet Take 1 tablet (25 mg total) by mouth 3 (three) times daily as needed for anxiety. 60 tablet 0   ibuprofen (ADVIL) 600 MG tablet Take 1 tablet (600 mg total) by mouth every 6 (six) hours as needed. 30 tablet 0   lidocaine (SALONPAS PAIN RELIEVING) 4 % Place 1 patch onto the skin daily. 15 patch 0   methocarbamol (ROBAXIN) 500 MG tablet Take 1 tablet (500 mg total) by mouth 2 (two) times daily. (Patient taking differently: Take 500 mg by mouth daily as needed for muscle spasms.) 20 tablet 0   No current facility-administered  medications for this visit.     Musculoskeletal: Strength & Muscle Tone: within normal limits Gait & Station: normal Patient leans: N/A  Psychiatric Specialty Exam: Review of Systems  Psychiatric/Behavioral:  Negative for decreased concentration, dysphoric mood, hallucinations, self-injury, sleep disturbance and suicidal ideas. The patient is not nervous/anxious and is not hyperactive.     Blood pressure (!) 152/86, pulse 79, temperature 98.1 F (36.7 C), temperature source Oral, weight 149 lb 3.2 oz (67.7 kg), unknown if currently breastfeeding.Body mass index is 26.43 kg/m.  General Appearance: Well Groomed  Eye Contact:  Good  Speech:  Clear and Coherent and Normal Rate  Volume:  Normal  Mood:  Euthymic  Affect:  Appropriate  Thought Process:  Coherent and Descriptions of Associations: Intact  Orientation:  Full (Time, Place, and Person)  Thought Content: WDL  Suicidal Thoughts:  No  Homicidal Thoughts:  No  Memory:  Immediate;   Good Recent;   Good Remote;   Fair  Judgement:  Good  Insight:  Good  Psychomotor Activity:  Normal  Concentration:  Concentration: Good and Attention Span: Good  Recall:  Good  Fund of Knowledge: Good  Language: Good  Akathisia:  No  Handed:  Left  AIMS (if indicated): not done  Assets:  Communication Skills Desire for Improvement Housing Social Support  ADL's:  Intact  Cognition: WNL  Sleep:  Good   Screenings: GAD-7    Physiological scientist Office Visit from 03/29/2022 in [facility] Office Visit from 10/09/2021 in [facility] Office Visit from 08/07/2021 in [facility] Office Visit from 06/20/2021 in [facility] Counselor from 06/18/2021 in [facility]  Total GAD-7 Score 0 0 0 17 19      PHQ2-9    [place] Office Visit from 03/29/2022 in [facility] Office Visit  from 10/09/2021 in [facility] Office Visit from 08/07/2021 in [facility] Office Visit from 06/20/2021 in [facility] Counselor from 06/18/2021 in [facility]  PHQ-2 Total Score 0 0 0 5 5  PHQ-9 Total Score -- -- -- 20 23      [place] Visit from 03/29/2022 in [facility] Office Visit from 10/09/2021 in [facility] ED from 09/13/2021 in [facility] Emergency Department at [place] No Risk No Risk No Risk        Assessment and Plan:   Jill Pena is a 47 year old, Caucasian female with a past psychiatric history significant for generalized anxiety disorder and major depressive disorder who presents to [facility] for follow up and medication management.  Patient reports no issues or concerns regarding her current medication regimen.  Patient denies experiencing any adverse side effects and denies the need for dosage adjustments on her medications at this time.  Patient was informed that during her next encounter, provider would continue to taper her off her clonazepam.  Patient vocalized understanding.  Patient medications to be e-prescribed to pharmacy of choice.  Collaboration of Care: Collaboration of Care: Medication Management AEB provider managing patient's psychiatric medications and Psychiatrist AEB patient being followed by a mental health provider.  Patient/Guardian was advised Release of Information must be obtained prior to any record release in order to collaborate their care with an outside provider. Patient/Guardian was advised if they have not already done so to contact the registration department to sign all necessary forms in order for [place] to release information regarding their care.   Consent: Patient/Guardian gives verbal  consent for treatment and assignment of benefits for services provided during this visit. Patient/Guardian expressed understanding and agreed to proceed.   1. GAD (generalized anxiety disorder)  - clonazePAM (KLONOPIN) 0.5 MG tablet; Take 1 tablet (0.5 mg total) by mouth 2 (two) times daily as needed for anxiety.  Dispense: 60 tablet; Refill: 1 - escitalopram (LEXAPRO) 10 MG tablet; Take 1 tablet (10 mg total) by mouth daily.  Dispense: 30 tablet; Refill: 1 - busPIRone (BUSPAR) 7.5 MG tablet; Take 1 tablet (7.5 mg total) by mouth 2 (two) times daily.  Dispense: 60 tablet; Refill: 1  2. Major depressive disorder, recurrent episode, moderate (  HCC)  - escitalopram (LEXAPRO) 10 MG tablet; Take 1 tablet (10 mg total) by mouth daily.  Dispense: 30 tablet; Refill: 1  Patient to follow up in 2 months Provider spent a total of 13 minutes with the patient/reviewing the patient's charts.  Meta Hatchet, PA 03/29/2022, 12:38 PM

## 2022-03-31 ENCOUNTER — Emergency Department (HOSPITAL_COMMUNITY)
Admission: EM | Admit: 2022-03-31 | Discharge: 2022-04-01 | Disposition: A | Discharge status: Other Acute Inpatient Hospital | Payer: Medicaid Other | Attending: Emergency Medicine | Admitting: Emergency Medicine

## 2022-03-31 ENCOUNTER — Other Ambulatory Visit: Payer: Self-pay

## 2022-03-31 ENCOUNTER — Emergency Department (HOSPITAL_COMMUNITY): Payer: Medicaid Other

## 2022-03-31 DIAGNOSIS — F411 Generalized anxiety disorder: Secondary | ICD-10-CM | POA: Diagnosis not present

## 2022-03-31 DIAGNOSIS — Z62810 Personal history of physical and sexual abuse in childhood: Secondary | ICD-10-CM | POA: Diagnosis not present

## 2022-03-31 DIAGNOSIS — Z20822 Contact with and (suspected) exposure to covid-19: Secondary | ICD-10-CM | POA: Diagnosis not present

## 2022-03-31 DIAGNOSIS — F15951 Other stimulant use, unspecified with stimulant-induced psychotic disorder with hallucinations: Secondary | ICD-10-CM | POA: Diagnosis not present

## 2022-03-31 DIAGNOSIS — F333 Major depressive disorder, recurrent, severe with psychotic symptoms: Secondary | ICD-10-CM | POA: Diagnosis not present

## 2022-03-31 DIAGNOSIS — Z23 Encounter for immunization: Secondary | ICD-10-CM | POA: Diagnosis not present

## 2022-03-31 DIAGNOSIS — F29 Unspecified psychosis not due to a substance or known physiological condition: Secondary | ICD-10-CM | POA: Diagnosis not present

## 2022-03-31 DIAGNOSIS — H109 Unspecified conjunctivitis: Secondary | ICD-10-CM | POA: Diagnosis not present

## 2022-03-31 DIAGNOSIS — R4781 Slurred speech: Secondary | ICD-10-CM | POA: Diagnosis not present

## 2022-03-31 DIAGNOSIS — Z91148 Patient's other noncompliance with medication regimen for other reason: Secondary | ICD-10-CM | POA: Insufficient documentation

## 2022-03-31 DIAGNOSIS — S61412A Laceration without foreign body of left hand, initial encounter: Secondary | ICD-10-CM | POA: Diagnosis not present

## 2022-03-31 DIAGNOSIS — F1910 Other psychoactive substance abuse, uncomplicated: Secondary | ICD-10-CM | POA: Insufficient documentation

## 2022-03-31 DIAGNOSIS — R059 Cough, unspecified: Secondary | ICD-10-CM | POA: Insufficient documentation

## 2022-03-31 DIAGNOSIS — M7989 Other specified soft tissue disorders: Secondary | ICD-10-CM | POA: Diagnosis not present

## 2022-03-31 DIAGNOSIS — Z5901 Sheltered homelessness: Secondary | ICD-10-CM | POA: Insufficient documentation

## 2022-03-31 DIAGNOSIS — Z62811 Personal history of psychological abuse in childhood: Secondary | ICD-10-CM | POA: Insufficient documentation

## 2022-03-31 DIAGNOSIS — X789XXA Intentional self-harm by unspecified sharp object, initial encounter: Secondary | ICD-10-CM | POA: Insufficient documentation

## 2022-03-31 DIAGNOSIS — Z79899 Other long term (current) drug therapy: Secondary | ICD-10-CM | POA: Insufficient documentation

## 2022-03-31 DIAGNOSIS — S6992XA Unspecified injury of left wrist, hand and finger(s), initial encounter: Secondary | ICD-10-CM | POA: Diagnosis present

## 2022-03-31 LAB — CBC WITH DIFFERENTIAL/PLATELET
Abs Immature Granulocytes: 0.04 10*3/uL (ref 0.00–0.07)
Basophils Absolute: 0.1 10*3/uL (ref 0.0–0.1)
Basophils Relative: 0 %
Eosinophils Absolute: 0.1 10*3/uL (ref 0.0–0.5)
Eosinophils Relative: 1 %
HCT: 38.6 % (ref 36.0–46.0)
Hemoglobin: 12.9 g/dL (ref 12.0–15.0)
Immature Granulocytes: 0 %
Lymphocytes Relative: 21 %
Lymphs Abs: 2.3 10*3/uL (ref 0.7–4.0)
MCH: 32 pg (ref 26.0–34.0)
MCHC: 33.4 g/dL (ref 30.0–36.0)
MCV: 95.8 fL (ref 80.0–100.0)
Monocytes Absolute: 0.8 10*3/uL (ref 0.1–1.0)
Monocytes Relative: 7 %
Neutro Abs: 7.9 10*3/uL — ABNORMAL HIGH (ref 1.7–7.7)
Neutrophils Relative %: 71 %
Platelets: 467 10*3/uL — ABNORMAL HIGH (ref 150–400)
RBC: 4.03 MIL/uL (ref 3.87–5.11)
RDW: 13.1 % (ref 11.5–15.5)
WBC: 11.2 10*3/uL — ABNORMAL HIGH (ref 4.0–10.5)
nRBC: 0 % (ref 0.0–0.2)

## 2022-03-31 LAB — RAPID URINE DRUG SCREEN, HOSP PERFORMED
Amphetamines: POSITIVE — AB
Barbiturates: NOT DETECTED
Benzodiazepines: POSITIVE — AB
Cocaine: POSITIVE — AB
Opiates: NOT DETECTED
Tetrahydrocannabinol: NOT DETECTED

## 2022-03-31 LAB — COMPREHENSIVE METABOLIC PANEL
ALT: 16 U/L (ref 0–44)
AST: 22 U/L (ref 15–41)
Albumin: 3.9 g/dL (ref 3.5–5.0)
Alkaline Phosphatase: 72 U/L (ref 38–126)
Anion gap: 10 (ref 5–15)
BUN: 15 mg/dL (ref 6–20)
CO2: 24 mmol/L (ref 22–32)
Calcium: 9 mg/dL (ref 8.9–10.3)
Chloride: 102 mmol/L (ref 98–111)
Creatinine, Ser: 0.91 mg/dL (ref 0.44–1.00)
GFR, Estimated: >60 mL/min (ref >60)
Glucose, Bld: 91 mg/dL (ref 70–99)
Potassium: 3.6 mmol/L (ref 3.5–5.1)
Sodium: 136 mmol/L (ref 135–145)
Total Bilirubin: 0.9 mg/dL (ref 0.3–1.2)
Total Protein: 7.4 g/dL (ref 6.5–8.1)

## 2022-03-31 LAB — RESP PANEL BY RT-PCR (RSV, FLU A&B, COVID)  RVPGX2
Influenza A by PCR: NEGATIVE
Influenza B by PCR: NEGATIVE
Resp Syncytial Virus by PCR: NEGATIVE
SARS Coronavirus 2 by RT PCR: NEGATIVE

## 2022-03-31 LAB — SALICYLATE LEVEL: Salicylate Lvl: <7 mg/dL — ABNORMAL LOW (ref 7.0–30.0)

## 2022-03-31 LAB — I-STAT BETA HCG BLOOD, ED (MC, WL, AP ONLY): I-stat hCG, quantitative: <5 m[IU]/mL (ref <5)

## 2022-03-31 LAB — ACETAMINOPHEN LEVEL: Acetaminophen (Tylenol), Serum: <10 ug/mL — ABNORMAL LOW (ref 10–30)

## 2022-03-31 LAB — ETHANOL: Alcohol, Ethyl (B): <10 mg/dL (ref <10)

## 2022-03-31 MED ORDER — HYDROCODONE-ACETAMINOPHEN 5-325 MG PO TABS
1.0000 | ORAL_TABLET | Freq: Once | ORAL | Status: AC
  Administered 2022-03-31: 1 via ORAL
  Filled 2022-03-31: qty 1

## 2022-03-31 MED ORDER — AMOXICILLIN-POT CLAVULANATE 875-125 MG PO TABS: 1.0000 | ORAL_TABLET | Freq: Two times a day (BID) | ORAL | Status: DC

## 2022-03-31 MED ORDER — TETANUS-DIPHTH-ACELL PERTUSSIS 5-2.5-18.5 LF-MCG/0.5 IM SUSY
0.5000 mL | PREFILLED_SYRINGE | Freq: Once | INTRAMUSCULAR | Status: AC
  Administered 2022-03-31: 0.5 mL via INTRAMUSCULAR
  Filled 2022-03-31: qty 0.5

## 2022-03-31 MED ORDER — AMOXICILLIN-POT CLAVULANATE 875-125 MG PO TABS
1.0000 | ORAL_TABLET | Freq: Once | ORAL | Status: AC
  Administered 2022-03-31: 1 via ORAL
  Filled 2022-03-31: qty 1

## 2022-03-31 NOTE — ED Notes (Signed)
Pt belongings removed from pt. Bagged up a pink bag, a pair of pants, a tshirt, a cardigan, and one grey sock. Pt belongings put in locker 1 in purple zone. Pt dressed in purple scrubs.
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
## 2022-03-31 NOTE — ED Notes (Signed)
Pt belongings placed in locker #5.

## 2022-03-31 NOTE — ED Notes (Signed)
IVC paperwork complete and in purple zone, expires 04/07/22

## 2022-03-31 NOTE — ED Provider Triage Note (Signed)
Emergency Medicine Provider Triage Evaluation Note  Jill Pena , a 47 y.o. female  was evaluated in triage.  Pt complains of pain in her left hand.  Pt is here with police for ivc.    Review of Systems  Positive: Eye redness Negative: weakness  Physical Exam  BP 134/64 (BP Location: Right Arm)   Pulse (!) 110   Temp 97.6 F (36.4 C) (Oral)   Resp 18   Wt 68 kg   SpO2 98%   BMI 26.57 kg/m  Gen:   Awake, no distress   Resp:  Normal effort  MSK:   Moves extremities without difficulty  Other:    Medical Decision Making  Medically screening exam initiated at 6:08 PM.  Appropriate orders placed.  Caidyn M Silguero was informed that the remainder of the evaluation will be completed by another provider, this initial triage assessment does not replace that evaluation, and the importance of remaining in the ED until their evaluation is complete.     Fransico Meadow, [place] 03/31/22 1811

## 2022-04-01 ENCOUNTER — Ambulatory Visit (HOSPITAL_COMMUNITY)
Admission: EM | Admit: 2022-04-01 | Discharge: 2022-04-01 | Disposition: A | Discharge status: Home / Self Care | Payer: Medicaid Other | Source: Home / Self Care

## 2022-04-01 DIAGNOSIS — F333 Major depressive disorder, recurrent, severe with psychotic symptoms: Secondary | ICD-10-CM | POA: Insufficient documentation

## 2022-04-01 DIAGNOSIS — R4781 Slurred speech: Secondary | ICD-10-CM | POA: Insufficient documentation

## 2022-04-01 DIAGNOSIS — Z5901 Sheltered homelessness: Secondary | ICD-10-CM | POA: Insufficient documentation

## 2022-04-01 DIAGNOSIS — F330 Major depressive disorder, recurrent, mild: Secondary | ICD-10-CM

## 2022-04-01 DIAGNOSIS — M7989 Other specified soft tissue disorders: Secondary | ICD-10-CM | POA: Insufficient documentation

## 2022-04-01 DIAGNOSIS — F411 Generalized anxiety disorder: Secondary | ICD-10-CM | POA: Insufficient documentation

## 2022-04-01 DIAGNOSIS — Z62810 Personal history of physical and sexual abuse in childhood: Secondary | ICD-10-CM | POA: Insufficient documentation

## 2022-04-01 DIAGNOSIS — Z91148 Patient's other noncompliance with medication regimen for other reason: Secondary | ICD-10-CM | POA: Insufficient documentation

## 2022-04-01 DIAGNOSIS — Z79899 Other long term (current) drug therapy: Secondary | ICD-10-CM | POA: Insufficient documentation

## 2022-04-01 DIAGNOSIS — Z62811 Personal history of psychological abuse in childhood: Secondary | ICD-10-CM | POA: Insufficient documentation

## 2022-04-01 MED ORDER — ACETAMINOPHEN 325 MG PO TABS: 650.0000 mg | ORAL_TABLET | Freq: Four times a day (QID) | ORAL | Status: DC | PRN

## 2022-04-01 MED ORDER — CLONAZEPAM 0.5 MG PO TABS
0.5000 mg | ORAL_TABLET | Freq: Two times a day (BID) | ORAL | Status: DC | PRN
  Administered 2022-04-01: 0.5 mg via ORAL
  Filled 2022-04-01: qty 1

## 2022-04-01 MED ORDER — IBUPROFEN 600 MG PO TABS: 600.0000 mg | ORAL_TABLET | Freq: Four times a day (QID) | ORAL | Status: DC | PRN

## 2022-04-01 MED ORDER — MAGNESIUM HYDROXIDE 400 MG/5ML PO SUSP: 30.0000 mL | Freq: Every day | ORAL | Status: DC | PRN

## 2022-04-01 MED ORDER — OLOPATADINE HCL 0.1 % OP SOLN
1.0000 [drp] | Freq: Two times a day (BID) | OPHTHALMIC | Status: DC
  Filled 2022-04-01: qty 5

## 2022-04-01 MED ORDER — NICOTINE 21 MG/24HR TD PT24
21.0000 mg | MEDICATED_PATCH | Freq: Once | TRANSDERMAL | Status: DC
  Administered 2022-04-01: 21 mg via TRANSDERMAL
  Filled 2022-04-01: qty 1

## 2022-04-01 MED ORDER — IBUPROFEN 400 MG PO TABS
400.0000 mg | ORAL_TABLET | Freq: Once | ORAL | Status: AC | PRN
  Administered 2022-04-01: 400 mg via ORAL
  Filled 2022-04-01: qty 1

## 2022-04-01 MED ORDER — OLOPATADINE HCL 0.1 % OP SOLN: 1.0000 [drp] | Freq: Two times a day (BID) | OPHTHALMIC | 12 refills | Status: AC

## 2022-04-01 MED ORDER — TRAZODONE HCL 50 MG PO TABS: 50.0000 mg | ORAL_TABLET | Freq: Every evening | ORAL | Status: DC | PRN

## 2022-04-01 MED ORDER — BUSPIRONE HCL 15 MG PO TABS
7.5000 mg | ORAL_TABLET | Freq: Two times a day (BID) | ORAL | Status: DC
  Administered 2022-04-01: 7.5 mg via ORAL
  Filled 2022-04-01: qty 1

## 2022-04-01 MED ORDER — ALUM & MAG HYDROXIDE-SIMETH 200-200-20 MG/5ML PO SUSP: 30.0000 mL | ORAL | Status: DC | PRN

## 2022-04-01 MED ORDER — ESCITALOPRAM OXALATE 10 MG PO TABS
10.0000 mg | ORAL_TABLET | Freq: Every day | ORAL | Status: DC
  Administered 2022-04-01: 10 mg via ORAL
  Filled 2022-04-01: qty 1

## 2022-04-01 NOTE — ED Notes (Signed)
TTS in process 

## 2022-04-01 NOTE — Discharge Instructions (Addendum)
Please keep scheduled appointment with Psychiatric provider Trinna Post NP with [facility].   Discussed methods to reduce the risk of self-injury or suicide attempts: Frequent conversations regarding unsafe thoughts. Remove all significant sharps. Remove all firearms. Remove all medications, including over-the-counter medications. Consider lockbox for medications and having a responsible person dispense medications until patient has strengthened coping skills. Room checks for sharps or other harmful objects. Secure all chemical substances that can be ingested or inhaled.    Safety Plan Jill Pena will reach out to her boyfriend, brother, call 911 or call mobile crisis, or go to nearest emergency room if condition worsens or if suicidal thoughts become active Patients' will follow up with Trinna Post PA for outpatient psychiatric services (therapy/medication management).  The suicide prevention education provided includes the following: Suicide risk factors Suicide prevention and interventions National Suicide Hotline telephone number [facility] assessment telephone number [place] and/or Residential Mobile Crisis Unit telephone number Request made of family/significant other to:  boyfriend Remove weapons (e.g., guns, rifles, knives), all items previously/currently identified as safety concern.   Remove drugs/medications (over the counter, prescriptions, illicit drugs), all items previously/currently identified as a safety concern.
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
## 2022-04-01 NOTE — ED Notes (Signed)
ED Provider at bedside. 

## 2022-04-01 NOTE — ED Notes (Signed)
Patient to the unit - denies SI, HI, AVH - will continue to monitor for safety
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
## 2022-04-01 NOTE — ED Notes (Addendum)
Patient admitted to [facility] endorsing depression with reported hallucinations. Patient was cooperative during the admission assessment. Skin assessment complete. Belongings inventoried. Patient oriented to unit and unit rules. Meal and drinks offered to patient. Patient verbalized agreement to treatment plans. Patient verbally contracts for safety during hospitalization. Will continue to monitor for safety.

## 2022-04-01 NOTE — Progress Notes (Addendum)
Received Torah this AM asleep in her chair bed, she continued to sleep in late this morning. She woke up ate and received her medications. Later she spoke with the provider and received her discharge orders. She was given medication for her pink eye, AVS, and questions answered. She retrieved her personal items and was transported to the hotel per her request.

## 2022-04-05 ENCOUNTER — Encounter (HOSPITAL_COMMUNITY): Payer: Medicaid Other | Admitting: Psychiatry

## 2022-05-24 ENCOUNTER — Ambulatory Visit (INDEPENDENT_AMBULATORY_CARE_PROVIDER_SITE_OTHER): Payer: Medicaid Other | Admitting: Physician Assistant

## 2022-05-24 ENCOUNTER — Encounter (HOSPITAL_COMMUNITY): Payer: Self-pay | Admitting: Physician Assistant

## 2022-05-24 DIAGNOSIS — F330 Major depressive disorder, recurrent, mild: Secondary | ICD-10-CM | POA: Diagnosis not present

## 2022-05-24 DIAGNOSIS — F411 Generalized anxiety disorder: Secondary | ICD-10-CM

## 2022-05-24 MED ORDER — BUSPIRONE HCL 7.5 MG PO TABS: 7.5000 mg | ORAL_TABLET | Freq: Two times a day (BID) | ORAL | 2 refills | Status: DC

## 2022-05-24 MED ORDER — ESCITALOPRAM OXALATE 10 MG PO TABS: 10.0000 mg | ORAL_TABLET | Freq: Every day | ORAL | 2 refills | Status: DC

## 2022-05-24 MED ORDER — CLONAZEPAM 0.5 MG PO TABS: 0.5000 mg | ORAL_TABLET | Freq: Every day | ORAL | 0 refills | Status: AC | PRN

## 2022-05-24 MED ORDER — HYDROXYZINE HCL 25 MG PO TABS: 25.0000 mg | ORAL_TABLET | Freq: Every evening | ORAL | 1 refills | Status: AC | PRN

## 2022-05-24 NOTE — Progress Notes (Signed)
BH MD/PA/NP OP Progress Note  05/24/2022 4:06 PM Jill Pena  MRN:  [id number]  Chief Complaint:  Chief Complaint  Patient presents with   Follow-up   Medication Refill   HPI:   Jill Pena is a 47 year old, Caucasian female with a past psychiatric history significant for generalized anxiety disorder and major depressive disorder who presents to [facility] for follow up and medication management.  Patient is currently being managed on the following psychiatric medications:  Buspirone 7.5 mg 2 times daily Escitalopram 10 mg daily Clonazepam 0.5 mg 2 times daily as needed  Patient presents to the encounter stating that she has been having issues with her sleep.  Patient reports that she has tried trazodone in the past and states that the medication kept her up and made her heart feel "jittery."  Provider suggested hydroxyzine for the management of her sleep disturbances to which the patient agreed to trying out.  Patient denies depressive symptoms at this time.  Patient further denies anxiety and denies any new stressors at this time.  Per chart review, patient was previously [employer] at [facility] via [employer]. Patient was admitted to [facility] for continuous assessment and overnight observation. When asked about her previous admission, patient states that her brother IVCed because she wouldn't tell him her business.  Patient denied any major issues at the time of her admission.  Patient's previous IVC papers stated the following: Patient was [facility] by her brother Jill Pena due to concerns substance use, noncompliance with medications and psychosis. Per IVC petition: Respondent has been diagnosed with bipolar, respondent has been prescribed medications but is not currently taking them.  Respondent is hallucinating and hearing voices.  Respondent is calling out names of people that are not present.  Respondent is  aggressive and hostile.  Respondent punched a wall today and refused medical attention. Respondent is abusing illegal drugs.  Respondent is slurring on the phone and appears to be incoherent. Respondent is a danger to herself.   Patient is alert and oriented x 4, calm, cooperative, and fully engaged in conversation during the encounter.  Patient endorses being on a regular Pena.  Patient denies suicidal or homicidal ideations.  She further denies auditory or visual hallucinations and does not appear to be responding to internal/external stimuli.  Patient endorses good sleep and receives on average 7 to 10 hours of sleep each night.  Patient endorses good appetite and eats on average 3 meals per day.  Patient denies alcohol consumption and illicit drug use.  Patient endorses tobacco use and smokes on average a pack per day.  Visit Diagnosis:    ICD-10-CM   1. GAD (generalized anxiety disorder)  F41.1 busPIRone (BUSPAR) 7.5 MG tablet    escitalopram (LEXAPRO) 10 MG tablet    hydrOXYzine (ATARAX) 25 MG tablet    clonazePAM (KLONOPIN) 0.5 MG tablet    2. Mild episode of recurrent major depressive disorder (HCC)  F33.0 escitalopram (LEXAPRO) 10 MG tablet      Past Psychiatric History:  Generalized anxiety disorder Major depressive disorder   Past Medical History:  Past Medical History:  Diagnosis Date   Anxiety    Genital herpes 2022    Past Surgical History:  Procedure Laterality Date   FEMUR SURGERY     TIBIA FRACTURE SURGERY      Family Psychiatric History:  Mother - anxiety/panic attacks Grandmother (maternal) - OCD  Family History:  Family History  Problem  Relation Age of Onset   Anxiety disorder Mother     Social History:  Social History   Socioeconomic History   Marital status: Divorced    Spouse name: Not on file   Number of children: Not on file   Years of education: Not on file   Highest education level: Not on file  Occupational History   Not on file  Tobacco  Use   Smoking status: Every Day    Packs/day: 1    Types: Cigarettes   Smokeless tobacco: Never  Substance and Sexual Activity   Alcohol use: Not Currently   Drug use: Not Currently   Sexual activity: Yes    Partners: Male    Birth control/protection: None  Other Topics Concern   Not on file  Social History Narrative   Not on file   Social Determinants of Health   Financial Resource Strain: High Risk (06/18/2021)   Overall Financial Resource Strain (CARDIA)    Difficulty of Paying Living Expenses: Very hard  Food Insecurity: Food Insecurity Present (06/18/2021)   Hunger Vital Sign    Worried About [place] in the Last Year: Often true    Ran Out of Food in the Last Year: Often true  Transportation Needs: Unmet Transportation Needs (06/18/2021)   PRAPARE - Hydrologist (Medical): Yes    Lack of Transportation (Non-Medical): Yes  Physical Activity: Inactive (06/18/2021)   Exercise Vital Sign    Days of Exercise per Week: 0 days    Minutes of Exercise per Session: 0 min  Stress: Stress Concern Present (06/18/2021)   [place]    Feeling of Stress : Very much  Social Connections: Socially Isolated (06/18/2021)   Social Connection and Isolation Panel [NHANES]    Frequency of Communication with Friends and Family: Once a week    Frequency of Social Gatherings with Friends and Family: Once a week    Attends Religious Services: Never    Marine scientist or Organizations: No    Attends Music therapist: Never    Marital Status: Divorced    Allergies: No Known Allergies  Metabolic Disorder Labs: No results found for: "HGBA1C", "MPG" No results found for: "PROLACTIN" No results found for: "CHOL", "TRIG", "HDL", "CHOLHDL", "VLDL", "LDLCALC" No results found for: "TSH"  Therapeutic Level Labs: No results found for: "LITHIUM" No results found for:  "VALPROATE" No results found for: "CBMZ"  Current Medications: Current Outpatient Medications  Medication Sig Dispense Refill   busPIRone (BUSPAR) 7.5 MG tablet Take 1 tablet (7.5 mg total) by mouth 2 (two) times daily. 60 tablet 2   [START ON 05/28/2022] clonazePAM (KLONOPIN) 0.5 MG tablet Take 1 tablet (0.5 mg total) by mouth daily as needed for anxiety. 30 tablet 0   escitalopram (LEXAPRO) 10 MG tablet Take 1 tablet (10 mg total) by mouth daily. 30 tablet 2   hydrOXYzine (ATARAX) 25 MG tablet Take 1 tablet (25 mg total) by mouth at bedtime as needed for anxiety. 30 tablet 1   olopatadine (PATANOL) 0.1 % ophthalmic solution Place 1 drop into the left eye 2 (two) times daily. 5 mL 12   No current facility-administered medications for this visit.     Musculoskeletal: Strength & Muscle Tone: within normal limits Gait & Station: normal Patient leans: N/A  Psychiatric Specialty Exam: Review of Systems  Psychiatric/Behavioral:  Negative for decreased concentration, dysphoric Pena, hallucinations, self-injury, sleep disturbance  and suicidal ideas. The patient is not nervous/anxious and is not hyperactive.     Blood pressure 121/72, pulse 97, temperature 98.3 F (36.8 C), temperature source Oral, height 5\' 3"  (1.6 m), weight 144 lb (65.3 kg), SpO2 100 %, unknown if currently breastfeeding.Body mass index is 25.51 kg/m.  General Appearance: Well Groomed  Eye Contact:  Good  Speech:  Clear and Coherent and Normal Rate  Volume:  Normal  Pena:  Euthymic  Affect:  Appropriate  Thought Process:  Coherent and Descriptions of Associations: Intact  Orientation:  Full (Time, Place, and Person)  Thought Content: WDL   Suicidal Thoughts:  No  Homicidal Thoughts:  No  Memory:  Immediate;   Good Recent;   Good Remote;   Fair  Judgement:  Good  Insight:  Good  Psychomotor Activity:  Normal  Concentration:  Concentration: Good and Attention Span: Good  Recall:  Good  Fund of Knowledge: Good   Language: Good  Akathisia:  No  Handed:  Left  AIMS (if indicated): not done  Assets:  Communication Skills Desire for Improvement Housing Social Support  ADL's:  Intact  Cognition: WNL  Sleep:  Good   Screenings: GAD-7    Flowsheet Row Clinical Support from 05/24/2022 in [facility] Office Visit from 03/29/2022 in [facility] Office Visit from 10/09/2021 in [facility] Office Visit from 08/07/2021 in [facility] Office Visit from 06/20/2021 in [facility]  Total GAD-7 Score 0 0 0 0 17      PHQ2-9    Flowsheet Row Clinical Support from 05/24/2022 in [facility] ED from 04/01/2022 in [facility] Office Visit from 03/29/2022 in [facility] Office Visit from 10/09/2021 in [facility] Office Visit from 08/07/2021 in [place]  PHQ-2 Total Score 0 0 0 0 0      Flowsheet Row Clinical Support from 05/24/2022 in [facility] ED from 03/31/2022 in [facility] Emergency Department at [facility] Office Visit from 03/29/2022 in [place] No Risk No Risk No Risk        Assessment and Plan:   Neeta M. Opie is a 47 year old, Caucasian female with a past psychiatric history significant for generalized anxiety disorder and major depressive disorder who presents to [facility] for follow up and medication management.  Presents to the encounter stating that she has been having issues with sleep.  Patient has used trazodone in the past but states that the medication made her feel wired and her heart feel "jittery."  Provider recommended hydroxyzine 25 mg at bedtime as needed for the management of  her sleep issues.  Patient was agreeable to recommendation.  Patient denies any issues or concerns regarding her current medication regimen.  Patient denies experiencing any adverse side effects at this time.  Patient denies depression or anxiety and further denies any new stressors currently.  Provider informed patient that her Klonopin will continue to be tapered.  Provider to place patient on Klonopin 0.5 mg daily as needed.  Patient vocalized understanding.  Patient's medications to be e-prescribed to pharmacy of choice.  Collaboration of Care: Collaboration of Care: Medication Management AEB provider managing patient's psychiatric medications and Psychiatrist AEB patient being followed by a mental health provider.  Patient/Guardian was advised  Release of Information must be obtained prior to any record release in order to collaborate their care with an outside provider. Patient/Guardian was advised if they have not already done so to contact the registration department to sign all necessary forms in order for [place] to release information regarding their care.   Consent: Patient/Guardian gives verbal consent for treatment and assignment of benefits for services provided during this visit. Patient/Guardian expressed understanding and agreed to proceed.   1. GAD (generalized anxiety disorder)  - busPIRone (BUSPAR) 7.5 MG tablet; Take 1 tablet (7.5 mg total) by mouth 2 (two) times daily.  Dispense: 60 tablet; Refill: 2 - escitalopram (LEXAPRO) 10 MG tablet; Take 1 tablet (10 mg total) by mouth daily.  Dispense: 30 tablet; Refill: 2 - hydrOXYzine (ATARAX) 25 MG tablet; Take 1 tablet (25 mg total) by mouth at bedtime as needed for anxiety.  Dispense: 30 tablet; Refill: 1 - clonazePAM (KLONOPIN) 0.5 MG tablet; Take 1 tablet (0.5 mg total) by mouth daily as needed for anxiety.  Dispense: 30 tablet; Refill: 0  2. Mild episode of recurrent major depressive disorder (HCC)  - escitalopram (LEXAPRO) 10 MG  tablet; Take 1 tablet (10 mg total) by mouth daily.  Dispense: 30 tablet; Refill: 2  Patient to follow up in 2 months Provider spent a total of 15 minutes with the patient/reviewing the patient's charts.  Jill Mood, PA 05/24/2022, 4:06 PM

## 2022-06-26 ENCOUNTER — Other Ambulatory Visit (HOSPITAL_COMMUNITY): Payer: Self-pay | Admitting: Physician Assistant

## 2022-06-26 ENCOUNTER — Telehealth (HOSPITAL_COMMUNITY): Payer: Self-pay

## 2022-06-26 DIAGNOSIS — F411 Generalized anxiety disorder: Secondary | ICD-10-CM

## 2022-06-27 ENCOUNTER — Other Ambulatory Visit (HOSPITAL_COMMUNITY): Payer: Self-pay | Admitting: Physician Assistant

## 2022-06-27 DIAGNOSIS — F1991 Other psychoactive substance use, unspecified, in remission: Secondary | ICD-10-CM

## 2022-06-27 DIAGNOSIS — F411 Generalized anxiety disorder: Secondary | ICD-10-CM

## 2022-06-27 NOTE — Progress Notes (Signed)
Provider evaluating patient over suspicion of his use of medication/possible drug use.

## 2022-07-16 ENCOUNTER — Encounter (HOSPITAL_COMMUNITY): Payer: Medicaid Other | Admitting: Physician Assistant

## 2022-07-16 ENCOUNTER — Telehealth (HOSPITAL_COMMUNITY): Payer: Self-pay | Admitting: *Deleted

## 2022-07-16 NOTE — Telephone Encounter (Signed)
Patient called asking if her UDS results are back and if she can get her stimulant medication.

## 2022-07-17 ENCOUNTER — Ambulatory Visit (INDEPENDENT_AMBULATORY_CARE_PROVIDER_SITE_OTHER): Payer: Medicaid Other | Admitting: Physician Assistant

## 2022-07-17 DIAGNOSIS — F411 Generalized anxiety disorder: Secondary | ICD-10-CM

## 2022-07-17 DIAGNOSIS — F330 Major depressive disorder, recurrent, mild: Secondary | ICD-10-CM | POA: Diagnosis not present

## 2022-07-17 LAB — URINE DRUG PANEL 7
Amphetamines, Urine: NEGATIVE ng/mL
Barbiturate Quant, Ur: NEGATIVE ng/mL
Benzodiazepine Quant, Ur: NEGATIVE ng/mL
Cannabinoid Quant, Ur: NEGATIVE ng/mL
Cocaine (Metab.): NEGATIVE ng/mL
Opiate Quant, Ur: NEGATIVE ng/mL
PCP Quant, Ur: NEGATIVE ng/mL

## 2022-07-17 MED ORDER — ESCITALOPRAM OXALATE 10 MG PO TABS: 10.0000 mg | ORAL_TABLET | Freq: Every day | ORAL | 1 refills | Status: AC

## 2022-07-17 MED ORDER — BUSPIRONE HCL 7.5 MG PO TABS: 7.5000 mg | ORAL_TABLET | Freq: Two times a day (BID) | ORAL | 1 refills | Status: AC

## 2022-07-17 NOTE — Progress Notes (Signed)
BH MD/PA/NP OP Progress Note  07/17/2022 9:40 PM Jill Pena  MRN:  [id number]  Chief Complaint:  Chief Complaint  Patient presents with   Follow-up   Medication Refill   HPI:   Jill Pena is a 47 year old, Caucasian female with a past psychiatric history significant for generalized anxiety disorder and major depressive disorder who presents to [facility] for follow up and medication management.  Patient is currently being managed on the following psychiatric medications:  Buspirone 7.5 mg 2 times daily Escitalopram 10 mg daily Clonazepam 0.5 mg 2 times daily as needed  During the start of the encounter, provider discussed patient's urine drug screen results.  According to patient's urine drug screen, the patient had no illicit substances in her urine; however, patient's results were suspicious due to having no traces of benzodiazepine.  The urine drug screen was originally obtained due to one of the nurses overhearing during a phone call with the patient that the patient was selling her clonazepam.  It was baffled with the results of her latest urine drug screen and denied accusations regarding selling her clonazepam.  Provider also mentioned to patient that her previous urine drug screen obtained on 03/31/2022 was significant for amphetamines, cocaine, and benzodiazepine.  Due to the current circumstance, provider informed patient that she would no longer have clonazepam prescribed to her due to possible misuse of her medications.  Patient was angry with the news regarding her clonazepam.  After informing patient that she would no longer be prescribed clonazepam, patient would answer some of her questions nonchalantly or in a hurry.  Patient denied depression and further denied any anxiety at this time but stated that her anxiety was going to elevate if she did not have her clonazepam.  Patient denies any other stressors at this  time.  Patient is alert and oriented x 4, calm, cooperative, and fully engaged in conversation during the encounter.  Patient describes her mood as "pissed off."  Patient denies suicidal or homicidal ideations.  She further denies auditory or visual hallucinations and does not appear to be responding to internal/external stimuli.  Patient endorses good sleep and receives on average 8 to 10 hours of sleep per night.  Patient endorses good appetite and eats on average 3-4 meals per day.  Patient denies alcohol consumption or illicit drug use.  Patient endorses tobacco use and smokes on average a pack per day.  Visit Diagnosis:    ICD-10-CM   1. GAD (generalized anxiety disorder)  F41.1 busPIRone (BUSPAR) 7.5 MG tablet    escitalopram (LEXAPRO) 10 MG tablet    2. Mild episode of recurrent major depressive disorder (HCC)  F33.0 escitalopram (LEXAPRO) 10 MG tablet      Past Psychiatric History:  Generalized anxiety disorder Major depressive disorder   Past Medical History:  Past Medical History:  Diagnosis Date   Anxiety    Genital herpes 2022    Past Surgical History:  Procedure Laterality Date   FEMUR SURGERY     TIBIA FRACTURE SURGERY      Family Psychiatric History:  Mother - anxiety/panic attacks Grandmother (maternal) - OCD  Family History:  Family History  Problem Relation Age of Onset   Anxiety disorder Mother     Social History:  Social History   Socioeconomic History   Marital status: Divorced    Spouse name: Not on file   Number of children: Not on file   Years of education: Not on file  Highest education level: Not on file  Occupational History   Not on file  Tobacco Use   Smoking status: Every Day    Packs/day: 1    Types: Cigarettes   Smokeless tobacco: Never  Substance and Sexual Activity   Alcohol use: Not Currently   Drug use: Not Currently   Sexual activity: Yes    Partners: Male    Birth control/protection: None  Other Topics Concern   Not  on file  Social History Narrative   Not on file   Social Determinants of Health   Financial Resource Strain: High Risk (06/18/2021)   Overall Financial Resource Strain (CARDIA)    Difficulty of Paying Living Expenses: Very hard  Food Insecurity: Food Insecurity Present (06/18/2021)   Hunger Vital Sign    Worried About Running Out of Food in the Last Year: Often true    Ran Out of Food in the Last Year: Often true  Transportation Needs: Unmet Transportation Needs (06/18/2021)   PRAPARE - Administrator, Civil Service (Medical): Yes    Lack of Transportation (Non-Medical): Yes  Physical Activity: Inactive (06/18/2021)   Exercise Vital Sign    Days of Exercise per Week: 0 days    Minutes of Exercise per Session: 0 min  Stress: Stress Concern Present (06/18/2021)   [employer] of Occupational Health - Occupational Stress Questionnaire    Feeling of Stress : Very much  Social Connections: Socially Isolated (06/18/2021)   Social Connection and Isolation Panel [NHANES]    Frequency of Communication with Friends and Family: Once a week    Frequency of Social Gatherings with Friends and Family: Once a week    Attends Religious Services: Never    Database administrator or Organizations: No    Attends Engineer, structural: Never    Marital Status: Divorced    Allergies: No Known Allergies  Metabolic Disorder Labs: No results found for: "HGBA1C", "MPG" No results found for: "PROLACTIN" No results found for: "CHOL", "TRIG", "HDL", "CHOLHDL", "VLDL", "LDLCALC" No results found for: "TSH"  Therapeutic Level Labs: No results found for: "LITHIUM" No results found for: "VALPROATE" No results found for: "CBMZ"  Current Medications: Current Outpatient Medications  Medication Sig Dispense Refill   busPIRone (BUSPAR) 7.5 MG tablet Take 1 tablet (7.5 mg total) by mouth 2 (two) times daily. 60 tablet 1   clonazePAM (KLONOPIN) 0.5 MG tablet Take 1 tablet (0.5 mg total)  by mouth daily as needed for anxiety. 30 tablet 0   escitalopram (LEXAPRO) 10 MG tablet Take 1 tablet (10 mg total) by mouth daily. 30 tablet 1   hydrOXYzine (ATARAX) 25 MG tablet Take 1 tablet (25 mg total) by mouth at bedtime as needed for anxiety. 30 tablet 1   olopatadine (PATANOL) 0.1 % ophthalmic solution Place 1 drop into the left eye 2 (two) times daily. 5 mL 12   No current facility-administered medications for this visit.     Musculoskeletal: Strength & Muscle Tone: within normal limits Gait & Station: normal Patient leans: N/A  Psychiatric Specialty Exam: Review of Systems  Psychiatric/Behavioral:  Positive for agitation. Negative for decreased concentration, dysphoric mood, hallucinations, self-injury, sleep disturbance and suicidal ideas. The patient is not nervous/anxious and is not hyperactive.     unknown if currently breastfeeding.There is no height or weight on file to calculate BMI.  General Appearance: Well Groomed  Eye Contact:  Good  Speech:  Clear and Coherent and Normal Rate  Volume:  Normal  Mood:  Angry  Affect:  Congruent  Thought Process:  Coherent and Descriptions of Associations: Intact  Orientation:  Full (Time, Place, and Person)  Thought Content: WDL   Suicidal Thoughts:  No  Homicidal Thoughts:  No  Memory:  Immediate;   Good Recent;   Good Remote;   Fair  Judgement:  Good  Insight:  Good  Psychomotor Activity:  Normal  Concentration:  Concentration: Good and Attention Span: Good  Recall:  Good  Fund of Knowledge: Good  Language: Good  Akathisia:  No  Handed:  Left  AIMS (if indicated): not done  Assets:  Communication Skills Desire for Improvement Housing Social Support  ADL's:  Intact  Cognition: WNL  Sleep:  Good   Screenings: GAD-7    Flowsheet Row Clinical Support from 07/17/2022 in [facility] Clinical Support from 05/24/2022 in [facility] Office Visit from 03/29/2022  in [facility] Office Visit from 10/09/2021 in [facility] Office Visit from 08/07/2021 in [facility]  Total GAD-7 Score 0 0 0 0 0      PHQ2-9    Flowsheet Row Clinical Support from 07/17/2022 in [facility] Clinical Support from 05/24/2022 in [facility] ED from 04/01/2022 in [facility] Office Visit from 03/29/2022 in [facility] Office Visit from 10/09/2021 in [facility]  PHQ-2 Total Score 0 0 0 0 0      Flowsheet Row Clinical Support from 07/17/2022 in [facility] Clinical Support from 05/24/2022 in [facility] ED from 03/31/2022 in [facility] Emergency Department at [facility]  C-SSRS RISK CATEGORY No Risk No Risk No Risk        Assessment and Plan:   Jill Pena is a 47 year old, Caucasian female with a past psychiatric history significant for generalized anxiety disorder and major depressive disorder who presents to [facility] for follow up and medication management.  Provider discussed patient's most recent urine drug screen results.  Patient was informed that her urine drug screen was devoid of any illicit substances; however, if she has been taking her clonazepam, she should have had clonazepam in her system.  Patient expressed that she does not know how her urine would have been clear of benzodiazepine.  Provider also mentioned to patient that the nurse overheard the patient over the phone date that she was going to sell her medication.  Patient denied the accusations that she was selling her clonazepam.  Lastly, provider mention to patient that she has a history of illicit substances in her urine.  Patient's last urine drug screen resulted on 03/31/2022  was significant for cocaine, amphetamine, and benzodiazepine.  Due to patient's current drug screen and past drug screen, provider made the decision to no longer prescribe patient clonazepam due to history of misuse.  Patient denied experiencing depression or anxiety; however, they stated that their anxiety would be elevated without the clonazepam.  Provider allow for time at the end of the encounter to discuss potential adverse side effects to patient's current medication regimen.  Collaboration of Care: Collaboration of Care: Medication Management AEB provider managing patient's psychiatric medications and Psychiatrist AEB patient being followed by a mental health provider.  Patient/Guardian was advised Release of Information must be obtained prior to any record release in order to collaborate  their care with an outside provider. Patient/Guardian was advised if they have not already done so to contact the registration department to sign all necessary forms in order for [place] to release information regarding their care.   Consent: Patient/Guardian gives verbal consent for treatment and assignment of benefits for services provided during this visit. Patient/Guardian expressed understanding and agreed to proceed.   1. GAD (generalized anxiety disorder)  - busPIRone (BUSPAR) 7.5 MG tablet; Take 1 tablet (7.5 mg total) by mouth 2 (two) times daily.  Dispense: 60 tablet; Refill: 2 - escitalopram (LEXAPRO) 10 MG tablet; Take 1 tablet (10 mg total) by mouth daily.  Dispense: 30 tablet; Refill: 2 - hydrOXYzine (ATARAX) 25 MG tablet; Take 1 tablet (25 mg total) by mouth at bedtime as needed for anxiety.  Dispense: 30 tablet; Refill: 1 - clonazePAM (KLONOPIN) 0.5 MG tablet; Take 1 tablet (0.5 mg total) by mouth daily as needed for anxiety.  Dispense: 30 tablet; Refill: 0  2. Mild episode of recurrent major depressive disorder (HCC)  - escitalopram (LEXAPRO) 10 MG tablet; Take 1 tablet (10 mg total) by mouth  daily.  Dispense: 30 tablet; Refill: 2  Patient to follow up in 2 months Provider spent a total of 15 minutes with the patient/reviewing the patient's charts.  Meta Hatchet, PA 07/17/2022, 9:40 PM

## 2022-08-02 ENCOUNTER — Encounter (HOSPITAL_COMMUNITY): Payer: Medicaid Other | Admitting: Physician Assistant

## 2022-08-28 ENCOUNTER — Encounter (HOSPITAL_COMMUNITY): Payer: Medicaid Other | Admitting: Physician Assistant

## 2023-01-02 ENCOUNTER — Ambulatory Visit (HOSPITAL_COMMUNITY)
Admission: EM | Admit: 2023-01-02 | Discharge: 2023-01-02 | Disposition: A | Discharge status: Home / Self Care | Payer: Medicaid Other | Attending: Internal Medicine | Admitting: Internal Medicine

## 2023-01-02 ENCOUNTER — Encounter (HOSPITAL_COMMUNITY): Payer: Self-pay

## 2023-01-02 DIAGNOSIS — J209 Acute bronchitis, unspecified: Secondary | ICD-10-CM | POA: Diagnosis not present

## 2023-01-02 DIAGNOSIS — F17210 Nicotine dependence, cigarettes, uncomplicated: Secondary | ICD-10-CM | POA: Diagnosis not present

## 2023-01-02 MED ORDER — PREDNISONE 20 MG PO TABS: 40.0000 mg | ORAL_TABLET | Freq: Every day | ORAL | 0 refills | Status: AC

## 2023-01-02 MED ORDER — ALBUTEROL SULFATE (2.5 MG/3ML) 0.083% IN NEBU
INHALATION_SOLUTION | RESPIRATORY_TRACT | Status: AC
  Filled 2023-01-02: qty 3

## 2023-01-02 MED ORDER — ALBUTEROL SULFATE HFA 108 (90 BASE) MCG/ACT IN AERS: 1.0000 | INHALATION_SPRAY | Freq: Four times a day (QID) | RESPIRATORY_TRACT | 0 refills | Status: AC | PRN

## 2023-01-02 MED ORDER — ALBUTEROL SULFATE (2.5 MG/3ML) 0.083% IN NEBU
2.5000 mg | INHALATION_SOLUTION | Freq: Once | RESPIRATORY_TRACT | Status: AC
  Administered 2023-01-02: 2.5 mg via RESPIRATORY_TRACT

## 2023-01-02 NOTE — ED Triage Notes (Signed)
Pt c/o cough x1.5-2wks, c/o SOB on exertion with chest tightness and wheezing since yesterday. Taken OTC meds with no relief.

## 2023-01-02 NOTE — Discharge Instructions (Addendum)
You have bronchitis which is inflammation of the upper airways in your lungs due to a virus. The following medicines will help with your symptoms.   - Take steroid pills sent to pharmacy as directed. Do not take any other NSAID containing medications such as ibuprofen or naproxen/Aleve while taking prednisone. - You may use albuterol inhaler 1 to 2 puffs every 4-6 hours as needed for cough, shortness of breath, and wheezing. - Take cough medicines as prescribed.  - Continue using over the counter medicines as needed as directed. Plain mucinex (guaifenesin) over the counter may further help breakup mucus and help with symptoms.   If you develop any new or worsening symptoms or do not improve in the next 2 to 3 days, please return.  If your symptoms are severe, please go to the emergency room. Follow-up with PCP as needed.

## 2023-01-02 NOTE — ED Provider Notes (Signed)
[facility]    CSN: [id number] Arrival date & time: 01/02/23  1617      History   Chief Complaint Chief Complaint  Patient presents with   Cough   Shortness of Breath    HPI Jill Pena is a 47 y.o. female.   Patient presents to urgent care for evaluation of cough, nasal congestion, shortness of breath, and generalized chest discomfort associated with coughing that started approximately 1 to 2 weeks ago.  Symptoms worsened significantly yesterday when she started noting wheezing to the chest with coughing and intermittent shortness of breath.  Cough is minimally productive with yellow sputum, mostly dry.  She had a sore throat and fever/chills at the beginning of illness but states the symptoms have resolved completely.  Denies history of chronic respiratory problems.  Current everyday cigarette smoker, denies other drug/vape use.  Denies recent sick contacts with similar symptoms.  Taking over-the-counter medications without relief of symptoms.   Cough Associated symptoms: shortness of breath   Shortness of Breath Associated symptoms: cough     Past Medical History:  Diagnosis Date   Anxiety    Genital herpes 2022    Patient Active Problem List   Diagnosis Date Noted   Major depressive disorder, recurrent episode, moderate (HCC) 06/18/2021   GAD (generalized anxiety disorder) 06/18/2021    Past Surgical History:  Procedure Laterality Date   FEMUR SURGERY     TIBIA FRACTURE SURGERY      OB History     Gravida  1   Para      Term      Preterm      AB      Living         SAB      IAB      Ectopic      Multiple      Live Births               Home Medications    Prior to Admission medications   Medication Sig Start Date End Date Taking? Authorizing Provider  predniSONE (DELTASONE) 20 MG tablet Take 2 tablets (40 mg total) by mouth daily for 5 days. 01/02/23 01/07/23 Yes Koree Staheli, Donavan Burnet, FNP  busPIRone (BUSPAR) 7.5  MG tablet Take 1 tablet (7.5 mg total) by mouth 2 (two) times daily. 07/17/22 07/17/23  Nwoko, Tommas Olp, PA  clonazePAM (KLONOPIN) 0.5 MG tablet Take 1 tablet (0.5 mg total) by mouth daily as needed for anxiety. 05/28/22   Nwoko, Tommas Olp, PA  escitalopram (LEXAPRO) 10 MG tablet Take 1 tablet (10 mg total) by mouth daily. 07/17/22 07/17/23  Nwoko, Tommas Olp, PA  hydrOXYzine (ATARAX) 25 MG tablet Take 1 tablet (25 mg total) by mouth at bedtime as needed for anxiety. 05/24/22   Nwoko, Tommas Olp, PA  olopatadine (PATANOL) 0.1 % ophthalmic solution Place 1 drop into the left eye 2 (two) times daily. 04/01/22   Ardis Hughs, NP    Family History Family History  Problem Relation Age of Onset   Anxiety disorder Mother     Social History Social History   Tobacco Use   Smoking status: Every Day    Current packs/day: 1.00    Types: Cigarettes   Smokeless tobacco: Never  Substance Use Topics   Alcohol use: Not Currently   Drug use: Not Currently     Allergies   Patient has no known allergies.   Review of Systems Review of Systems  Respiratory:  Positive for cough and shortness of breath.   Per HPI   Physical Exam Triage Vital Signs ED Triage Vitals  Encounter Vitals Group     BP 01/02/23 1702 (!) 142/84     Systolic BP Percentile --      Diastolic BP Percentile --      Pulse Rate 01/02/23 1702 75     Resp 01/02/23 1702 18     Temp 01/02/23 1702 98.1 F (36.7 C)     Temp Source 01/02/23 1702 Oral     SpO2 01/02/23 1702 98 %     Weight --      Height --      Head Circumference --      Peak Flow --      Pain Score 01/02/23 1703 0     Pain Loc --      Pain Education --      Exclude from Growth Chart --    No data found.  Updated Vital Signs BP (!) 142/84 (BP Location: Left Arm)   Pulse 75   Temp 98.1 F (36.7 C) (Oral)   Resp 18   LMP 12/31/2022   SpO2 98%   Breastfeeding No   Visual Acuity Right Eye Distance:   Left Eye Distance:   Bilateral Distance:     Right Eye Near:   Left Eye Near:    Bilateral Near:     Physical Exam Vitals and nursing note reviewed.  Constitutional:      Appearance: She is not ill-appearing or toxic-appearing.  HENT:     Head: Normocephalic and atraumatic.     Right Ear: Hearing and external ear normal.     Left Ear: Hearing and external ear normal.     Nose: Nose normal.     Mouth/Throat:     Lips: Pink.  Eyes:     General: Lids are normal. Vision grossly intact. Gaze aligned appropriately.     Extraocular Movements: Extraocular movements intact.     Conjunctiva/sclera: Conjunctivae normal.  Cardiovascular:     Rate and Rhythm: Normal rate and regular rhythm.     Heart sounds: Normal heart sounds, S1 normal and S2 normal.  Pulmonary:     Effort: Pulmonary effort is normal. No respiratory distress.     Breath sounds: Normal air entry. Wheezing (Diffuse expiratory wheezing heard to bilateral lung fields) and rhonchi (Rhonchi heard to the bilateral lower lung fields, clears with cough) present.  Musculoskeletal:     Cervical back: Neck supple.  Skin:    General: Skin is warm and dry.     Capillary Refill: Capillary refill takes less than 2 seconds.     Findings: No rash.  Neurological:     General: No focal deficit present.     Mental Status: She is alert and oriented to person, place, and time. Mental status is at baseline.     Cranial Nerves: No dysarthria or facial asymmetry.  Psychiatric:        Mood and Affect: Mood normal.        Speech: Speech normal.        Behavior: Behavior normal.        Thought Content: Thought content normal.        Judgment: Judgment normal.      UC Treatments / Results  Labs (all labs ordered are listed, but only abnormal results are displayed) Labs Reviewed - No data to display  EKG   Radiology No results found.  Procedures  Procedures (including critical care time)  Medications Ordered in UC Medications  albuterol (PROVENTIL) (2.5 MG/3ML) 0.083%  nebulizer solution 2.5 mg (has no administration in time range)    Initial Impression / Assessment and Plan / UC Course  I have reviewed the triage vital signs and the nursing notes.  Pertinent labs & imaging results that were available during my care of the patient were reviewed by me and considered in my medical decision making (see chart for details).   1.  Acute bronchitis Presentation suspicious for acute bronchitis. Patient given albuterol nebulizer treatment in clinic with significant improvement in shortness of breath and cough/wheezing. Will manage this with steroid burst, albuterol as needed at home, and [facility] as needed for cough. Deferred imaging of the chest due to low suspicion for focal consolidation/pneumonia given stable cardiopulmonary exam findings after albuterol breathing treatment in clinic. PCP follow-up encouraged.  Counseled patient on potential for adverse effects with medications prescribed/recommended today, strict ER and return-to-clinic precautions discussed, patient verbalized understanding.    Final Clinical Impressions(s) / UC Diagnoses   Final diagnoses:  Acute bronchitis, unspecified organism  Cigarette nicotine dependence without complication     Discharge Instructions      You have bronchitis which is inflammation of the upper airways in your lungs due to a virus. The following medicines will help with your symptoms.   - Take steroid pills sent to pharmacy as directed. Do not take any other NSAID containing medications such as ibuprofen or naproxen/Aleve while taking prednisone. - You may use albuterol inhaler 1 to 2 puffs every 4-6 hours as needed for cough, shortness of breath, and wheezing. - Take cough medicines as prescribed.  - Continue using over the counter medicines as needed as directed. Plain mucinex (guaifenesin) over the counter may further help breakup mucus and help with symptoms.   If you develop any new or worsening  symptoms or do not improve in the next 2 to 3 days, please return.  If your symptoms are severe, please go to the emergency room. Follow-up with PCP as needed.    ED Prescriptions     Medication Sig Dispense Auth. Provider   predniSONE (DELTASONE) 20 MG tablet Take 2 tablets (40 mg total) by mouth daily for 5 days. 10 tablet Carlisle Beers, FNP      PDMP not reviewed this encounter.   Carlisle Beers, [place] 01/02/23 [place]

## 2023-03-03 NOTE — Telephone Encounter (Signed)
Request sent 
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
